# Patient Record
Sex: Female | Born: 1965 | Race: White | Hispanic: No | Marital: Married | Smoking: Current every day smoker
Health system: Southern US, Community
[De-identification: ages and names within clinical notes are randomized; demographics above are authoritative.]

## PROBLEM LIST (undated history)

## (undated) DIAGNOSIS — K219 Gastro-esophageal reflux disease without esophagitis: Secondary | ICD-10-CM

## (undated) DIAGNOSIS — Z91018 Allergy to other foods: Secondary | ICD-10-CM

## (undated) DIAGNOSIS — Z87442 Personal history of urinary calculi: Secondary | ICD-10-CM

## (undated) DIAGNOSIS — E559 Vitamin D deficiency, unspecified: Secondary | ICD-10-CM

## (undated) DIAGNOSIS — K5900 Constipation, unspecified: Secondary | ICD-10-CM

## (undated) DIAGNOSIS — M545 Low back pain, unspecified: Secondary | ICD-10-CM

## (undated) DIAGNOSIS — I10 Essential (primary) hypertension: Secondary | ICD-10-CM

## (undated) DIAGNOSIS — N289 Disorder of kidney and ureter, unspecified: Secondary | ICD-10-CM

## (undated) HISTORY — PX: CYSTOSCOPY W/ URETERAL STENT PLACEMENT: SHX1429

## (undated) HISTORY — DX: Disorder of kidney and ureter, unspecified: N28.9

## (undated) HISTORY — DX: Essential (primary) hypertension: I10

## (undated) HISTORY — DX: Constipation, unspecified: K59.00

## (undated) HISTORY — DX: Low back pain, unspecified: M54.50

## (undated) HISTORY — DX: Allergy to other foods: Z91.018

## (undated) HISTORY — PX: BLADDER ASPIRATION: SHX472

## (undated) HISTORY — DX: Vitamin D deficiency, unspecified: E55.9

---

## 1999-08-31 ENCOUNTER — Other Ambulatory Visit: Admission: RE | Admit: 1999-08-31 | Discharge: 1999-08-31 | Payer: Self-pay | Admitting: Obstetrics and Gynecology

## 2000-09-22 ENCOUNTER — Other Ambulatory Visit: Admission: RE | Admit: 2000-09-22 | Discharge: 2000-09-22 | Payer: Self-pay | Admitting: Obstetrics and Gynecology

## 2002-03-18 ENCOUNTER — Other Ambulatory Visit: Admission: RE | Admit: 2002-03-18 | Discharge: 2002-03-18 | Payer: Self-pay | Admitting: Obstetrics and Gynecology

## 2003-07-30 ENCOUNTER — Other Ambulatory Visit: Admission: RE | Admit: 2003-07-30 | Discharge: 2003-07-30 | Payer: Self-pay | Admitting: Obstetrics and Gynecology

## 2004-08-19 ENCOUNTER — Other Ambulatory Visit: Admission: RE | Admit: 2004-08-19 | Discharge: 2004-08-19 | Payer: Self-pay | Admitting: Obstetrics and Gynecology

## 2006-06-19 DIAGNOSIS — C4491 Basal cell carcinoma of skin, unspecified: Secondary | ICD-10-CM

## 2006-06-19 HISTORY — DX: Basal cell carcinoma of skin, unspecified: C44.91

## 2007-05-02 ENCOUNTER — Ambulatory Visit (HOSPITAL_COMMUNITY): Admission: RE | Admit: 2007-05-02 | Discharge: 2007-05-02 | Payer: Self-pay | Admitting: Obstetrics and Gynecology

## 2010-02-07 ENCOUNTER — Encounter (HOSPITAL_COMMUNITY): Payer: Self-pay | Admitting: Obstetrics and Gynecology

## 2010-06-01 NOTE — Op Note (Signed)
Nicole Pacheco, Nicole Pacheco NO.:  [id number]   MEDICAL RECORD NO.:  [id number]          PATIENT TYPE:  AMB   LOCATION:  SDC                           FACILITY:  [facility]   PHYSICIAN:  Juluis Mire, M.D.   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   OPERATIVE PROCEDURE:  1. Mid urethral sling using a transobturator approach.  2. Cystoscopy.   SURGEON:  Juluis Mire, M.D.   ANESTHESIA:  Spinal.   ESTIMATED BLOOD LOSS:  200 mL.   PACKS AND DRAINS:  None.   INTRAOPERATIVE BLOOD REPLACED:  None.   COMPLICATIONS:  None.   INDICATIONS:  As noted in the history and physical.   PROCEDURE AS FOLLOWS:  The patient was taken to the OR and placed in the  supine position.  After a satisfactory level of spinal anesthesia was  obtained, the patient was placed in the dorsal lithotomy position using  the Allen stirrups.  The perineum and vagina were prepped out with  Betadine and draped in a sterile field.  Bladder was emptied by in-and-  out catheterization.  A weighted speculum was placed in the vaginal  vault.  The suburethral area was instilled with 1/2% Xylocaine with  epinephrine.  An incision was made in the vaginal mucosa overlying the  urethra.  We then dissected out laterally to the obturator foramen on  each side.  An area over the obturator foramen on the vulvar area was  identified.  It was below the abductus longus tendon, but just lateral  to the inferior pubic ramus.  This area was infiltrated with 1/2%  Xylocaine with epinephrine, and an incision was made with the knife.   Next, the [facility] halo needles were brought into place.  They  were passed on each side through the skin  through the obturator foramen,  and around the inferior pubic ramus and out through the vaginal opening.  There was no evidence of buttonholing of the vaginal mucosa.  With the  needles in place cystoscopy was performed.  There was no evidence of  urethral or bladder injury from the past.  Both ureteral orifices were  visualized and noted to be spilling clear urine.  The cystoscope was  then removed.  We then brought the polypropylene mesh attached to the  needles, and brought it out through the skin incisions.  We adjusted the  mesh under the urethra to lay flat, but we could easily put a Kelly and  rotate it 90 degrees.  Once the plastic sheaths had  been removed, the  mesh was trimmed at the level of the skin.   Next, the vaginal mucosa was then closed with a room with interrupted  sutures of 2-0 Monocryl.  The skin was closed with Dermabond.  We had  fairly good hemostasis.  A Foley was placed to straight drain.  The  urine was slightly blood tinged, but this was from cystoscopy.  At this  point in time, the patient was taken out of the dorsal lithotomy  position, once alert transferred to recovery in good condition.  Sponge,  instrument and needle count were reported as correct by circulating  nurse.      Juluis Mire, M.D.  Electronically Signed     JSM/MEDQ  D:  05/02/2007  T:  05/02/2007  Job:  [id number]

## 2010-06-01 NOTE — H&P (Signed)
Nicole Pacheco, Nicole Pacheco NO.:  [id number]   MEDICAL RECORD NO.:  [id number]          PATIENT TYPE:  AMB   LOCATION:  SDC                           [facility]   PHYSICIAN:  Juluis Mire, M.D.   DATE OF BIRTH:  07/08/1965   DATE OF ADMISSION:  05/02/2007  DATE OF DISCHARGE:                              HISTORY & PHYSICAL   HISTORY OF PRESENT ILLNESS:  This is a 45 year old gravida 2, para 1,  abortus 1, female presents for mid urethral sling.   In relation to present admission: The patient had trouble with  increasing stress urinary incontinence.  She underwent urodynamic  testing in the office.  She had normal volume studies.  She had a  minimal postvoid residual.  She did leak.  Most leak point pressures  were over 100.  She had one spurious value of 49.  She had normal  urethral pressure profiles.  She had a normal voiding pattern with no  evidence of obstructive voiding pattern after review.  We had discussed  options. This would include biofeedback and bladder retraining versus  surgery.  She is in favor of the latter and proceeds now for mid  urethral sling.  We are going to use an obturator approach.   ALLERGIES:  In terms of allergies, she has no known drug allergies.   MEDICATIONS:  None.   PAST MEDICAL HISTORY:  Usual childhood diseases.   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  For birth control she has a Engineer, civil (consulting)  IUD.  She has had one vaginal delivery and one miscarriage.   FAMILY HISTORY:  Noncontributory.   SOCIAL HISTORY:  Does reveal tobacco use.  No alcohol use.   REVIEW OF SYSTEMS:  Noncontributory.   PHYSICAL EXAMINATION:  VITAL SIGNS:  The patient is afebrile, stable  vital signs.  HEENT: The patient is normocephalic.  Pupils equal and react to light  and accommodation.  Extraocular were intact.  Sclerae and conjunctivae  are clear.  Oropharynx clear.  NECK:  Without  thyromegaly.  BREASTS:  No discrete masses.  LUNGS:  Clear.  CARDIOVASCULAR SYSTEM:  Regular rhythm and rate.  No murmurs or gallops.  ABDOMEN:  Abdominal exam is benign.  No mass, organomegaly or  tenderness.  PELVIC:  Normal external genitalia.  Vaginal mucosa is clear.  Mild  cystourethrocele.  Cervix unremarkable.  IUD string noted.  Uterus  normal size, shape and contour.  Adnexa free of masses or tenderness and  rectovaginal exam is clear.  EXTREMITIES:  Trace edema.  NEUROLOGIC:  Grossly within normal limits.   IMPRESSION:  Stress urinary incontinence.   PLAN:  The patient will undergo mid urethral sling use an obturator  approach.  Success rates of 80% are quoted.  The risks of surgery  explained including the risk of infection.  The risk of vascular injury  that could lead to hemorrhage requiring transfusion with the risk of  AIDS or hepatitis.  Risk of injury to adjacent organs such as bladder,  urethra or ureters  that could require further surgery.  Risk of  obturator nerve injury that could lead to chronic leg pain and weakness.  There is an increased risk of increasing bladder spasms that could lead  to urinary leakage requiring long-term medical therapy.  Over tightening  the sling could lead to inability to void requiring taking down the  sling with return of incontinence.  Risk of mesh erosions explained.  This could require removing the mesh.  There is also the risk of deep  venous thrombosis and pulmonary embolus.  The patient expressed  understanding of indications, risks and alternatives.      Juluis Mire, M.D.  Electronically Signed     JSM/MEDQ  D:  05/02/2007  T:  05/02/2007  Job:  [id number]

## 2010-09-22 ENCOUNTER — Emergency Department (HOSPITAL_BASED_OUTPATIENT_CLINIC_OR_DEPARTMENT_OTHER)
Admission: EM | Admit: 2010-09-22 | Discharge: 2010-09-22 | Disposition: A | Discharge status: Home / Self Care | Payer: Managed Care, Other (non HMO) | Attending: Emergency Medicine | Admitting: Emergency Medicine

## 2010-09-22 ENCOUNTER — Emergency Department (INDEPENDENT_AMBULATORY_CARE_PROVIDER_SITE_OTHER): Payer: Managed Care, Other (non HMO)

## 2010-09-22 ENCOUNTER — Encounter: Payer: Self-pay | Admitting: *Deleted

## 2010-09-22 DIAGNOSIS — M25529 Pain in unspecified elbow: Secondary | ICD-10-CM | POA: Insufficient documentation

## 2010-09-22 DIAGNOSIS — W010XXA Fall on same level from slipping, tripping and stumbling without subsequent striking against object, initial encounter: Secondary | ICD-10-CM | POA: Insufficient documentation

## 2010-09-22 DIAGNOSIS — Y92009 Unspecified place in unspecified non-institutional (private) residence as the place of occurrence of the external cause: Secondary | ICD-10-CM | POA: Insufficient documentation

## 2010-09-22 DIAGNOSIS — S6990XA Unspecified injury of unspecified wrist, hand and finger(s), initial encounter: Secondary | ICD-10-CM

## 2010-09-22 DIAGNOSIS — W19XXXA Unspecified fall, initial encounter: Secondary | ICD-10-CM

## 2010-09-22 DIAGNOSIS — M25519 Pain in unspecified shoulder: Secondary | ICD-10-CM | POA: Insufficient documentation

## 2010-09-22 DIAGNOSIS — S59909A Unspecified injury of unspecified elbow, initial encounter: Secondary | ICD-10-CM

## 2010-09-22 DIAGNOSIS — F17200 Nicotine dependence, unspecified, uncomplicated: Secondary | ICD-10-CM | POA: Insufficient documentation

## 2010-09-22 MED ORDER — IBUPROFEN 800 MG PO TABS: 800.0000 mg | ORAL_TABLET | Freq: Three times a day (TID) | ORAL | Status: AC

## 2010-09-22 NOTE — ED Provider Notes (Signed)
History     CSN: [id number] Arrival date & time: 09/22/2010  5:29 PM  Chief Complaint  Patient presents with  . Arm Injury   HPI Comments: Right elbow pain for the past week. Patient denies any specific trauma but was lifting some bottles in a grocery store and noticed arm pain later on that day. The pain is over her right lateral elbow and worse with palpation. She denies any weakness, numbness, tingling, fevers or vomiting. She has no pain or problems in her other joints. She also had a fall 4 days ago onto the same elbow and aggravated at that time. She has not been taking any medications at home.  The history is provided by the patient.    Past Medical History  Diagnosis Date  . Kidney calculi     Past Surgical History  Procedure Date  . Cesarean section   . Bladder aspiration     History reviewed. No pertinent family history.  History  Substance Use Topics  . Smoking status: Current Everyday Smoker -- 0.5 packs/day  . Smokeless tobacco: Not on file  . Alcohol Use: No    OB History    Grav Para Term Preterm Abortions TAB SAB Ect Mult Living                  Review of Systems  Constitutional: Negative for activity change and appetite change.  HENT: Negative for congestion and rhinorrhea.   Respiratory: Negative for cough, chest tightness and shortness of breath.   Gastrointestinal: Negative for vomiting and abdominal pain.  Genitourinary: Negative for dysuria and hematuria.  Musculoskeletal: Positive for myalgias and arthralgias.  Neurological: Negative for headaches.  Hematological: Negative for adenopathy.    Physical Exam  BP 151/93  Pulse 84  Temp(Src) 98.2 F (36.8 C) (Oral)  Resp 16  Wt 300 lb (136.079 kg)  SpO2 100%  Physical Exam  Constitutional: She is oriented to person, place, and time. She appears well-developed and well-nourished. No distress.  HENT:  Head: Normocephalic and atraumatic.  Mouth/Throat: Oropharynx is clear and moist. No  oropharyngeal exudate.  Eyes: Conjunctivae are normal. Pupils are equal, round, and reactive to light.  Neck: Normal range of motion.  Cardiovascular: Normal rate, regular rhythm and normal heart sounds.   Pulmonary/Chest: Effort normal and breath sounds normal. No respiratory distress.  Abdominal: Soft. There is no tenderness. There is no rebound and no guarding.  Musculoskeletal: Normal range of motion. She exhibits tenderness. She exhibits no edema.       Right elbow with tenderness to palpation over the lateral and medial condyles. There is full range of motion of the elbow joint. There is no effusion. There is no skin change. She does have some pain with pronation and supination.  Neurological: She is alert and oriented to person, place, and time. No cranial nerve deficit.  Skin: Skin is warm.    ED Course  Procedures  MDM Atraumatic elbow pain, neurovascularly intact with normal strength. No evidence of joint infection. Given pain with pronation and supination possibility of radial head fracture exists. We'll obtain imaging. Even if negative for fracture we'll place a sling and treat as suspected radial head fracture.  No results found for this or any previous visit. Dg Elbow Complete Right  09/22/2010  *RADIOLOGY REPORT*  Clinical Data: Pain post fall.  RIGHT ELBOW - COMPLETE 3+ VIEW  Comparison: None.  Findings: No effusion. Negative for fracture, dislocation, or other acute abnormality.  Normal alignment and  mineralization. No significant degenerative change.  Regional soft tissues unremarkable.  IMPRESSION:  Negative  Original Report Authenticated By: Osa Craver, M.D.          Glynn Octave, MD 09/22/10 [phone]

## 2010-09-22 NOTE — ED Notes (Signed)
Pt c/o right arm/elbow pain x 1 week.

## 2010-10-12 LAB — CBC
HCT: 38
MCHC: 35.9
MCV: 86.9
WBC: 7.8

## 2012-03-12 ENCOUNTER — Ambulatory Visit: Payer: Managed Care, Other (non HMO) | Attending: Sports Medicine | Admitting: Physical Therapy

## 2012-03-12 DIAGNOSIS — M2560 Stiffness of unspecified joint, not elsewhere classified: Secondary | ICD-10-CM | POA: Insufficient documentation

## 2012-03-12 DIAGNOSIS — M79609 Pain in unspecified limb: Secondary | ICD-10-CM | POA: Insufficient documentation

## 2012-03-12 DIAGNOSIS — M542 Cervicalgia: Secondary | ICD-10-CM | POA: Insufficient documentation

## 2012-03-12 DIAGNOSIS — IMO0001 Reserved for inherently not codable concepts without codable children: Secondary | ICD-10-CM | POA: Insufficient documentation

## 2012-03-14 ENCOUNTER — Ambulatory Visit: Payer: Managed Care, Other (non HMO) | Admitting: Physical Therapy

## 2012-03-19 ENCOUNTER — Encounter: Payer: Managed Care, Other (non HMO) | Admitting: Rehabilitation

## 2012-03-20 ENCOUNTER — Ambulatory Visit: Payer: Managed Care, Other (non HMO) | Attending: Sports Medicine | Admitting: Physical Therapy

## 2012-03-20 DIAGNOSIS — M542 Cervicalgia: Secondary | ICD-10-CM | POA: Insufficient documentation

## 2012-03-20 DIAGNOSIS — M79609 Pain in unspecified limb: Secondary | ICD-10-CM | POA: Insufficient documentation

## 2012-03-20 DIAGNOSIS — IMO0001 Reserved for inherently not codable concepts without codable children: Secondary | ICD-10-CM | POA: Insufficient documentation

## 2012-03-20 DIAGNOSIS — M2560 Stiffness of unspecified joint, not elsewhere classified: Secondary | ICD-10-CM | POA: Insufficient documentation

## 2012-03-22 ENCOUNTER — Ambulatory Visit: Payer: Managed Care, Other (non HMO) | Admitting: Rehabilitation

## 2012-03-26 ENCOUNTER — Ambulatory Visit: Payer: Managed Care, Other (non HMO) | Admitting: Rehabilitation

## 2012-03-29 ENCOUNTER — Ambulatory Visit: Payer: Managed Care, Other (non HMO) | Admitting: Physical Therapy

## 2012-04-02 ENCOUNTER — Ambulatory Visit: Payer: Managed Care, Other (non HMO) | Admitting: Rehabilitation

## 2012-04-05 ENCOUNTER — Ambulatory Visit: Payer: Managed Care, Other (non HMO) | Admitting: Rehabilitation

## 2012-04-09 ENCOUNTER — Ambulatory Visit: Payer: Managed Care, Other (non HMO) | Admitting: Rehabilitation

## 2012-04-12 ENCOUNTER — Ambulatory Visit: Payer: Managed Care, Other (non HMO) | Admitting: Rehabilitation

## 2012-04-18 ENCOUNTER — Ambulatory Visit: Payer: Managed Care, Other (non HMO) | Attending: Sports Medicine | Admitting: Rehabilitation

## 2012-04-18 DIAGNOSIS — M79609 Pain in unspecified limb: Secondary | ICD-10-CM | POA: Insufficient documentation

## 2012-04-18 DIAGNOSIS — IMO0001 Reserved for inherently not codable concepts without codable children: Secondary | ICD-10-CM | POA: Insufficient documentation

## 2012-04-18 DIAGNOSIS — M2560 Stiffness of unspecified joint, not elsewhere classified: Secondary | ICD-10-CM | POA: Insufficient documentation

## 2012-04-18 DIAGNOSIS — M542 Cervicalgia: Secondary | ICD-10-CM | POA: Insufficient documentation

## 2012-04-23 ENCOUNTER — Ambulatory Visit: Payer: Managed Care, Other (non HMO) | Admitting: Rehabilitation

## 2012-04-25 ENCOUNTER — Ambulatory Visit: Payer: Managed Care, Other (non HMO) | Admitting: Rehabilitation

## 2012-05-01 ENCOUNTER — Ambulatory Visit: Payer: Managed Care, Other (non HMO) | Admitting: Rehabilitation

## 2012-05-03 ENCOUNTER — Ambulatory Visit: Payer: Managed Care, Other (non HMO) | Admitting: Rehabilitation

## 2012-05-07 ENCOUNTER — Ambulatory Visit: Payer: Managed Care, Other (non HMO) | Admitting: Rehabilitation

## 2012-05-10 ENCOUNTER — Encounter: Payer: Managed Care, Other (non HMO) | Admitting: Rehabilitation

## 2012-05-14 ENCOUNTER — Encounter: Payer: Managed Care, Other (non HMO) | Admitting: Rehabilitation

## 2012-05-17 ENCOUNTER — Encounter: Payer: Managed Care, Other (non HMO) | Admitting: Rehabilitation

## 2014-03-12 ENCOUNTER — Encounter: Payer: Self-pay | Admitting: Rehabilitation

## 2014-03-12 ENCOUNTER — Ambulatory Visit: Payer: 59 | Attending: Orthopedic Surgery | Admitting: Rehabilitation

## 2014-03-12 DIAGNOSIS — M542 Cervicalgia: Secondary | ICD-10-CM | POA: Insufficient documentation

## 2014-03-12 NOTE — Patient Instructions (Addendum)
Flexibility: Upper Trapezius Stretch   Gently grasp right side of head while reaching behind back with other hand. Tilt head away until a gentle stretch is felt. Hold _30___ seconds. Repeat _2_ times per set. Do __1__ sets per session. Do __2_ sessions per day.  http://orth.exer.us/340   Copyright  VHI. All rights reserved.  Levator Scapula Stretch   Place left hand on same side shoulder blade. With other hand, gently stretch head down and away. Hold _30___ seconds. Repeat _2___ times per set. Do __1__ sets per session. Do __2__ sessions per day.  http://orth.exer.us/348   Copyright  VHI. All rights reserved.  Flexibility: Corner Stretch   Standing in corner with hands just above shoulder level and feet ____ inches from corner, lean forward until a comfortable stretch is felt across chest. Hold _30___ seconds. Repeat _2___ times per set. Do __1__ sets per session. Do _2___ sessions per day.  http://orth.exer.us/342   Copyright  VHI. All rights reserved.  Flexibility: Neck Retraction   Pull head straight back, keeping eyes and jaw level. Repeat _15___ times per set. Do __1__ sets per session. Do __3__ sessions per day.  http://orth.exer.us/344   Copyright  VHI. All rights reserved.   Scapular Retraction (Standing)   With arms at sides, pinch shoulder blades together. Repeat _10___ times per set. Hold 10 seconds. Do _3___ sessions per day.  http://orth.exer.us/944   Copyright  VHI. All rights reserved.

## 2014-03-12 NOTE — Therapy (Signed)
[facility] [address]  [place], [place], [address] Phone: [phone]   Fax:  [phone]  Physical Therapy Evaluation  Patient Details  Name: Nicole Pacheco MRN: [id number] Date of Birth: 11/07/1965 Referring Provider:  Melina Schools, MD  Encounter Date: 03/12/2014      PT End of Session - 03/12/14 1708    Visit Number 1   Number of Visits 8   Date for PT Re-Evaluation 04/09/14   PT Start Time 8756   PT Stop Time 4332   PT Time Calculation (min) 60 min   Activity Tolerance --  good response to traction      Past Medical History  Diagnosis Date  . Kidney calculi     Past Surgical History  Procedure Laterality Date  . Cesarean section    . Bladder aspiration      There were no vitals taken for this visit.  Visit Diagnosis:  Neck pain on right side - Plan: PT plan of care cert/re-cert      Subjective Assessment - 03/12/14 1616    Symptoms Pt presents with c/o constant R sided neck, shoulder blade, and arm pain that has returned in the past month.  pt was seen here for the same issue that was resolved.  reports not doing exercises like she should.     Diagnostic tests xray showing DDD/OA   Patient Stated Goals decrease pain   Currently in Pain? Yes   Pain Score 6    Pain Location --  R neck and shoulder blade region   Pain Descriptors / Indicators Stabbing;Sharp   Pain Radiating Towards the R hand; feels more like a soreness.  Tingling does occur 2-3x per day with no pattern.    Pain Onset More than a month ago   Aggravating Factors  sleeping position, just hurts all the time, work   Pain Relieving Factors not moving   Effect of Pain on Daily Activities not cleaning the house          [facility] PT Assessment - 03/12/14 1610    Assessment   Medical Diagnosis neck OA/DDD   Onset Date 02/09/14   Next MD Visit as needed   Prior Therapy yes   Precautions   Precautions None   Balance Screen    Has the patient fallen in the past 6 months No   Prior Function   Level of Independence Independent with basic ADLs   Observation/Other Assessments   Focus on Therapeutic Outcomes (FOTO)  44%   Sensation   Light Touch Appears Intact   ROM / Strength   AROM / PROM / Strength AROM;PROM;Strength   AROM   Cervical Flexion 35   Cervical Extension 20  pain   Cervical - Right Side Bend 15  ** arm pain increased   Cervical - Left Side Bend 30   Cervical - Right Rotation 60   Cervical - Left Rotation 60   PROM   Overall PROM Comments pain with sidebend R to 15   Strength   Overall Strength Comments myotomes strong and painfree, grip equal   Palpation   Palpation +2 ttp R upper trapezius and cervical paraspinals   Special Tests    Special Tests Cervical   Cervical Tests Spurling's;Dictraction   Spurling's   Findings Negative   Side Right   Distraction Test   Findngs Positive   other    Comment all nerve tension tests normal  Performed HEP per patient instructions x 2 each with vcs for completion Cervical traction: 15#/0#, 60sec/20sec, x 38min at 20deg pull                  PT Education - 03/12/14 1707    Education provided Yes   Education Details HEP, POC, diagnosis   Person(s) Educated Patient   Methods Explanation;Demonstration;Handout   Comprehension Verbalized understanding;Returned demonstration          PT Short Term Goals - 03/12/14 1713    PT SHORT TERM GOAL #1   Title independent with initial HEP   Time 1   Period Weeks   Status On-going           PT Long Term Goals - 03/12/14 1713    PT LONG TERM GOAL #1   Title improve pain to intermittent only   Time 4   Period Weeks   Status New   PT LONG TERM GOAL #2   Title improve cervical sidebending to WNL without referred arm pain   Time 4   Period Weeks   Status New   PT LONG TERM GOAL #3   Title return to sleeping without increased pain   Time 4   Period Weeks                Plan - 03/12/14 1709    Clinical Impression Statement Pt presents with symptoms consistent with R sided cervical degeneration with referred pain into the R UE.  No nerve involvement indicated by special tests.  Pain is constant at rest and worsens with cervical sidebending to the Right.  Pain is decreased greatly with cervical traction.     Pt will benefit from skilled therapeutic intervention in order to improve on the following deficits Decreased range of motion;Pain   Rehab Potential Excellent   Clinical Impairments Affecting Rehab Potential none   PT Frequency 2x / week   PT Duration 4 weeks   PT Treatment/Interventions Electrical Stimulation;Moist Heat;Traction;Therapeutic exercise;Manual techniques   PT Next Visit Plan joint mobilizations, STM R side cervical, postural TE, traction   Consulted and Agree with Plan of Care Patient         Problem List There are no active problems to display for this patient.   Stark Bray, DPT, CMP 03/12/2014, 5:17 PM  [facility] [address]  [place], [place], [address] Phone: [phone]   Fax:  [phone]

## 2014-03-19 ENCOUNTER — Encounter: Payer: Self-pay | Admitting: Physical Therapy

## 2014-03-19 ENCOUNTER — Ambulatory Visit: Payer: 59 | Attending: Orthopedic Surgery | Admitting: Physical Therapy

## 2014-03-19 DIAGNOSIS — M436 Torticollis: Secondary | ICD-10-CM

## 2014-03-19 DIAGNOSIS — M542 Cervicalgia: Secondary | ICD-10-CM | POA: Insufficient documentation

## 2014-03-19 NOTE — Therapy (Signed)
[facility] [address]  [place], [place], [address] Phone: [phone]   Fax:  [phone]  Physical Therapy Treatment  Patient Details  Name: Carsen Machi MRN: [id number] Date of Birth: Mar 23, 1965 Referring Provider:  Melina Schools, MD  Encounter Date: 03/19/2014      PT End of Session - 03/19/14 1621    Visit Number 2   Number of Visits 8   Date for PT Re-Evaluation 04/09/14   PT Start Time 5956   PT Stop Time 1618   PT Time Calculation (min) 48 min      Past Medical History  Diagnosis Date  . Kidney calculi     Past Surgical History  Procedure Laterality Date  . Cesarean section    . Bladder aspiration      There were no vitals taken for this visit.  Visit Diagnosis:  Neck pain on right side  Neck stiffness      Subjective Assessment - 03/19/14 1535    Symptoms cc = R neck and scpular pain which she rates 5/10 currently.  States has difficulty sleeping due to pain stating gets 5-6 hours/sleep night.  c/o intermittent N/T into R digits #2-4 with reaching and with driving.   Currently in Pain? Yes   Pain Score 5    Pain Location --  R neck and scapula   Multiple Pain Sites No           TODAY'S TREATMENT: MANUAL THERAPY - seated STM R upper trap, lateral infraspinatus, and proximal triceps (all due to pain preventing her from lying supine).  Seated and Supine R 1st rib mobes grade 2 to 3 (good benefit).  Supine c-spine retraction mobes grade 3 (fair tolerance), supine L SB and R side-glide mobes grade 3 (good tolerance and pain reduction).  R UE nerve glides (POS median nerve symptom provocation today with nerve glides.  No radial nerve provocation with nerve glides despite radial nerve distribution of pain with palpation to radial nerve at proximal triceps).  Pt requested no traction today due to discomfort with lying supine.                   PT Short Term Goals -  03/19/14 1645    PT SHORT TERM GOAL #1   Title independent with initial HEP   Status Achieved           PT Long Term Goals - 03/19/14 1645    PT LONG TERM GOAL #1   Title improve pain to intermittent only   Status On-going   PT LONG TERM GOAL #2   Title improve cervical sidebending to WNL without referred arm pain   Status On-going   PT LONG TERM GOAL #3   Title return to sleeping without increased pain   Status On-going               Plan - 03/19/14 1622    Clinical Impression Statement pt with R neck pain and LOM along with R UE pain and intermittent N/T.  N/T noted in digits #2-4 and pain in R UE noted in posterior upper arm and into forearm.  UE pain as well as N/T easily provoked with closing R c-spine facets.  Also able to reproduce UE pain and hand N/T with palpation/STM to proximal R triceps.  Good relief with 1st rib mobes and with facet opening mobes.  Pt asked to not have traction today.   PT Next  Visit Plan joint mobilizations including R 1st rib, STM R side cervical and upper scapula, postural exercises and education, traction   Consulted and Agree with Plan of Care Patient        Problem List There are no active problems to display for this patient.   [facility] PT, OCS 03/19/2014, 5:39 PM  [facility] [address]  [place], [place], [address] Phone: [phone]   Fax:  [phone]

## 2014-03-25 ENCOUNTER — Ambulatory Visit: Payer: 59 | Admitting: Physical Therapy

## 2014-03-25 DIAGNOSIS — M542 Cervicalgia: Secondary | ICD-10-CM | POA: Diagnosis not present

## 2014-03-25 DIAGNOSIS — M436 Torticollis: Secondary | ICD-10-CM

## 2014-03-25 NOTE — Therapy (Signed)
[facility] [address]  [place], [place], [address] Phone: [phone]   Fax:  [phone]  Physical Therapy Treatment  Patient Details  Name: Katena Petitjean MRN: [id number] Date of Birth: [birth year] Referring Provider:  Melina Schools, MD  Encounter Date: 03/25/2014      PT End of Session - 03/25/14 1520    Visit Number 3   Number of Visits 8   Date for PT Re-Evaluation 04/09/14   PT Start Time 1505   PT Stop Time 1535   PT Time Calculation (min) 30 min   Activity Tolerance Patient limited by pain   Behavior During Therapy [facility] for tasks assessed/performed      Past Medical History  Diagnosis Date  . Kidney calculi     Past Surgical History  Procedure Laterality Date  . Cesarean section    . Bladder aspiration      There were no vitals taken for this visit.  Visit Diagnosis:  Neck pain on right side  Neck stiffness      Subjective Assessment - 03/25/14 1506    Symptoms pt is "really hurting" today.  Pain in neck/shoulder blade and RUE   Currently in Pain? Yes   Pain Score 7    Pain Location Neck   Pain Orientation Right;Posterior   Pain Descriptors / Indicators Constant;Stabbing;Shooting   Pain Type Chronic pain   Pain Onset More than a month ago                    [facility] Adult PT Treatment/Exercise - 03/25/14 1508    Neck Exercises: Machines for Strengthening   UBE (Upper Arm Bike) Level 1.0 x 5 min; 2.5 min forward/2.5 min backward   Modalities   Modalities Electrical Stimulation;Moist Heat   Moist Heat Therapy   Number Minutes Moist Heat 15 Minutes   Moist Heat Location Other (comment)  neck   Electrical Stimulation   Electrical Stimulation Location neck; t spine   Electrical Stimulation Action IFC   Electrical Stimulation Parameters to tolerance   Electrical Stimulation Goals Pain   Neck Exercises: Stretches   Upper Trapezius Stretch 2 reps;20 seconds  bil   Upper  Trapezius Stretch Limitations increased time and cues for technique                  PT Short Term Goals - 03/19/14 1645    PT SHORT TERM GOAL #1   Title independent with initial HEP   Status Achieved           PT Long Term Goals - 03/25/14 1522    PT LONG TERM GOAL #1   Title improve pain to intermittent only   Time 4   Period Weeks   Status On-going   PT LONG TERM GOAL #2   Title improve cervical sidebending to WNL without referred arm pain   Time 4   Period Weeks   Status On-going   PT LONG TERM GOAL #3   Title return to sleeping without increased pain   Time 4   Period Weeks   Status On-going               Plan - 03/25/14 1521    Clinical Impression Statement Pt arrived 20 min late for session and reports continued increase in pain.  Declined traction due to unability to tolerate postion.  Trialed estim and heat to see if pt notices decrease in pain.  Limited progress  towards goals.   PT Next Visit Plan joint mobilizations including R 1st rib, STM R side cervical and upper scapula, postural exercises and education, traction   Consulted and Agree with Plan of Care Patient        Problem List There are no active problems to display for this patient.  Laureen Abrahams, PT, DPT 03/25/2014 3:36 PM  [facility] [address]  [place], [place], [address] Phone: [phone]   Fax:  [phone]

## 2014-03-26 ENCOUNTER — Encounter: Payer: Self-pay | Admitting: Physical Therapy

## 2014-03-26 ENCOUNTER — Ambulatory Visit: Payer: 59 | Admitting: Physical Therapy

## 2014-03-26 DIAGNOSIS — M542 Cervicalgia: Secondary | ICD-10-CM

## 2014-03-26 NOTE — Patient Instructions (Signed)
Instructed in HEP to try: Corner pec stretch with chin tuck 3x20" 3x/day Low row Black TB with full scap retraction 20x 3x/day R UE nerve glides to tolerance 10x5" 3x/day

## 2014-03-26 NOTE — Therapy (Signed)
[facility] [address]  [place], [place], [address] Phone: [phone]   Fax:  [phone]  Physical Therapy Treatment  Patient Details  Name: Nicole Pacheco MRN: [id number] Date of Birth: 01-Jul-1965 Referring Provider:  Melina Schools, MD  Encounter Date: 03/26/2014      PT End of Session - 03/26/14 1540    Visit Number (p) 4   Number of Visits (p) 8   Date for PT Re-Evaluation (p) 04/09/14   PT Start Time (p) 1534      Past Medical History  Diagnosis Date  . Kidney calculi     Past Surgical History  Procedure Laterality Date  . Cesarean section    . Bladder aspiration      There were no vitals taken for this visit.  Visit Diagnosis:  No diagnosis found.      Subjective Assessment - 03/26/14 1535    Symptoms states feels a little better today stating pain is 4/10 at rest but up to 8/10 with movement.  States N/T has increased to include whole R hand (was only digits #2-3 last week). States noted 1 day of benefit following last manual therapy treatment by this PT. She states she is interested in trying dry needling today.   Currently in Pain? Yes   Pain Score 4    Pain Location Neck   Pain Orientation Right;Posterior  neck and upper R scapula         MANUAL THERAPY - seated STM R upper trap and scalenes.  Seated R 1st rib mobes grade 2 to 3. Dry needling seated (see below).  After sitting up from prolonged forward bend posture for above treatment pt noted increased pain to R UE.  She was positioned supine and applied manual traction and symptoms abolished.  Able to cause immediate return of symptoms with extension or with R side-bending PROM while supine.  No symptom provocation with flexion or L side-bending.  Performed Grade 3 retraction mobes to tolerance. Then pt performed Supine upper c-spine flexion 5x5".  After manual completed pt pain-free but upon sitting up R UE pain and N/T  returned.  Mechanical Traction: 12#/6#, 60/20", 20dg pull, 12' with IFC to upper t-spine and mid c-spine during traction x 12' (80-150Hz )  HEP instruct and perform.           Trigger Point Dry Needling - 03/26/14 1640    Consent Given? Yes   Muscles Treated Upper Body Upper trapezius;Suboccipitals muscle group   Upper Trapezius Response Twitch reponse elicited  no sig benefit noted   SubOccipitals Response --  no response, no benefit noted                PT Short Term Goals - 03/19/14 1645    PT SHORT TERM GOAL #1   Title independent with initial HEP   Status Achieved           PT Long Term Goals - 03/25/14 1522    PT LONG TERM GOAL #1   Title improve pain to intermittent only   Time 4   Period Weeks   Status On-going   PT LONG TERM GOAL #2   Title improve cervical sidebending to WNL without referred arm pain   Time 4   Period Weeks   Status On-going   PT LONG TERM GOAL #3   Title return to sleeping without increased pain   Time 4   Period Weeks   Status On-going  Plan - 03/25/14 1521    Clinical Impression Statement Pt arrived 20 min late for session and reports continued increase in pain.  Declined traction due to unability to tolerate postion.  Trialed estim and heat to see if pt notices decrease in pain.  Limited progress towards goals.   PT Next Visit Plan joint mobilizations including R 1st rib, STM R side cervical and upper scapula, postural exercises and education, traction   Consulted and Agree with Plan of Care Patient        Problem List There are no active problems to display for this patient.   [facility] PT, OCS 03/26/2014, 4:41 PM  [facility] [address]  [place], [place], [address] Phone: [phone]   Fax:  [phone]

## 2014-04-01 ENCOUNTER — Ambulatory Visit: Payer: 59 | Admitting: Physical Therapy

## 2014-04-01 DIAGNOSIS — M436 Torticollis: Secondary | ICD-10-CM

## 2014-04-01 DIAGNOSIS — M542 Cervicalgia: Secondary | ICD-10-CM

## 2014-04-01 NOTE — Therapy (Addendum)
[facility] [address]  [place], [place], [address] Phone: [phone]   Fax:  [phone]  Physical Therapy Treatment  Patient Details  Name: Nicole Pacheco MRN: [id number] Date of Birth: 07/29/1965 Referring Provider:  Melina Schools, MD  Encounter Date: 04/01/2014      PT End of Session - 04/01/14 0850    Visit Number 5   Number of Visits 8   Date for PT Re-Evaluation 04/09/14   PT Start Time 0848      Past Medical History  Diagnosis Date  . Kidney calculi     Past Surgical History  Procedure Laterality Date  . Cesarean section    . Bladder aspiration      There were no vitals filed for this visit.  Visit Diagnosis:  Neck pain on right side  Neck stiffness      Subjective Assessment - 04/01/14 0846    Symptoms states "top part of neck feels better" but continued pain in lower neck and all down shoulder/arm.  States pain 5-6/10 on AVG and 8/10 at worst.  Not sleeping well due to pain.  Sleeping in recliner at times due to pain.  Cont'd c/o N/T into R hand.  Pt to MD later today per my recs at last treatment due to increased pain.   Currently in Pain? Yes   Pain Score 5    Pain Location Neck   Pain Orientation Right;Posterior  neck and upper scapula   Multiple Pain Sites No         TODAY'S TREATMENT: MANUAL THERAPY - supine STM R scalenes, c-spine manual distraction and symptoms nearly abolished, performed R 1st rib mobes grade 2 and some grade 3 (no further change in symptoms), mobes into Ext or R SB caused R UE pain and NT.  Performed R side glide mobes in neutral and L SB/L Rot which further improved symptoms but did not increase ability to R side-bend or extend.  Attempted retraction mobes and only tolerated grade 2, grade 3 would create R UE radicular symptoms.  No sig improvement in ROM with manual but pt did report mild reduction in pain from 5/10 to 3-4/10 upon returning to seated  position (was painfree at times while performing manual in supine).  Mechanical Traction: 14#/7#, 60/20", 20dg pull, 17' with IFC to upper t-spine and mid c-spine during traction x 18' (80-150Hz ).  Reports "feel a little better but still in pain" following traction and IFC.                        PT Short Term Goals - 03/19/14 1645    PT SHORT TERM GOAL #1   Title independent with initial HEP   Status Achieved           PT Long Term Goals - 04/01/14 0936    PT LONG TERM GOAL #1   Title improve pain to intermittent only   Status On-going   PT LONG TERM GOAL #2   Title improve cervical sidebending to WNL without referred arm pain   Status On-going   PT LONG TERM GOAL #3   Title return to sleeping without increased pain   Status On-going               Plan - 04/01/14 0930    Clinical Impression Statement pt still with poorly controlled symptoms.  She states she did feel pretty good Friday and Saturday since last  treatment but otherwise states nearly constant pain and N/T into R UE. Pt with apparent nerve root impingemnt R c-spine but unclear if soley facet in nature or if some disc component given variability of symptom intensity.  Pt to see MD later today and I imagine an injection and/or MRI will be recommended.  Pt states she was offered an injection prior to beginning PT but she declined at that time but is now interested if thought beneficial.  Pt did not have N/T to R UE at last MD visit but she has had these symptoms since beginning PT intervention.   Pt will benefit from skilled therapeutic intervention in order to improve on the following deficits Decreased range of motion;Pain;Improper body mechanics   Rehab Potential Good   PT Treatment/Interventions Electrical Stimulation;Moist Heat;Traction;Therapeutic exercise;Manual techniques;Dry needling;Patient/family education   PT Next Visit Plan see what MD advised pt   Consulted and Agree with Plan of  Care Patient        Problem List There are no active problems to display for this patient.   [name] PT, OCS  04/01/2014, 9:37 AM  [facility] [address]  [place], [place], [address] Phone: [phone]   Fax:  [phone]    PHYSICAL THERAPY DISCHARGE SUMMARY  Visits from Start of Care: 5  Current functional level related to goals / functional outcomes: unknown   Remaining deficits: No change   Education / Equipment: HEP Plan: Patient agrees to discharge.  Patient goals were not met. Patient is being discharged due to lack of progress.  ?????        We are discharing Nicole Pacheco from PT due to lack of progress.  She was last seen on 04/01/14 and reviewing her medical chart in Epic I see she ended up undergoing cervical fusion last week.  Should she require additional treatment in the future we'd be happy to have her back.  Thank you!  Leonette Most PT, OCS 05/20/2014 2:34 PM

## 2014-04-03 ENCOUNTER — Ambulatory Visit: Payer: 59 | Admitting: Physical Therapy

## 2014-04-07 ENCOUNTER — Ambulatory Visit: Payer: 59 | Admitting: Rehabilitation

## 2014-04-10 ENCOUNTER — Ambulatory Visit: Payer: 59 | Admitting: Physical Therapy

## 2014-05-13 ENCOUNTER — Encounter (HOSPITAL_COMMUNITY): Payer: Self-pay

## 2014-05-13 ENCOUNTER — Encounter (HOSPITAL_COMMUNITY)
Admission: RE | Admit: 2014-05-13 | Discharge: 2014-05-13 | Disposition: A | Discharge status: Home / Self Care | Payer: 59 | Source: Ambulatory Visit | Attending: Orthopedic Surgery | Admitting: Orthopedic Surgery

## 2014-05-13 DIAGNOSIS — M5022 Other cervical disc displacement, mid-cervical region: Secondary | ICD-10-CM | POA: Diagnosis not present

## 2014-05-13 DIAGNOSIS — K219 Gastro-esophageal reflux disease without esophagitis: Secondary | ICD-10-CM | POA: Diagnosis not present

## 2014-05-13 DIAGNOSIS — F17210 Nicotine dependence, cigarettes, uncomplicated: Secondary | ICD-10-CM | POA: Diagnosis not present

## 2014-05-13 DIAGNOSIS — M5412 Radiculopathy, cervical region: Secondary | ICD-10-CM | POA: Diagnosis not present

## 2014-05-13 DIAGNOSIS — E669 Obesity, unspecified: Secondary | ICD-10-CM | POA: Diagnosis not present

## 2014-05-13 HISTORY — DX: Gastro-esophageal reflux disease without esophagitis: K21.9

## 2014-05-13 HISTORY — DX: Personal history of urinary calculi: Z87.442

## 2014-05-13 LAB — SURGICAL PCR SCREEN
MRSA, PCR: NEGATIVE
Staphylococcus aureus: NEGATIVE

## 2014-05-13 LAB — BASIC METABOLIC PANEL
ANION GAP: 9 (ref 5–15)
BUN: 11 mg/dL (ref 6–23)
CO2: 24 mmol/L (ref 19–32)
Calcium: 9 mg/dL (ref 8.4–10.5)
Chloride: 106 mmol/L (ref 96–112)
Creatinine, Ser: 0.68 mg/dL (ref 0.50–1.10)
GFR calc Af Amer: >90 mL/min (ref >90)
GFR calc non Af Amer: >90 mL/min (ref >90)
GLUCOSE: 88 mg/dL (ref 70–99)
Potassium: 4 mmol/L (ref 3.5–5.1)
SODIUM: 139 mmol/L (ref 135–145)

## 2014-05-13 LAB — CBC
HCT: 39.2 % (ref 36.0–46.0)
Hemoglobin: 13.2 g/dL (ref 12.0–15.0)
MCH: 28.8 pg (ref 26.0–34.0)
MCHC: 33.7 g/dL (ref 30.0–36.0)
MCV: 85.4 fL (ref 78.0–100.0)
PLATELETS: 247 10*3/uL (ref 150–400)
RBC: 4.59 MIL/uL (ref 3.87–5.11)
RDW: 15 % (ref 11.5–15.5)
WBC: 8.8 10*3/uL (ref 4.0–10.5)

## 2014-05-13 LAB — HCG, SERUM, QUALITATIVE: PREG SERUM: NEGATIVE

## 2014-05-13 NOTE — Pre-Procedure Instructions (Addendum)
Nicole Pacheco  05/13/2014   Your procedure is scheduled on:  05/15/14  Report to [facility] short stay admitting at 530 AM.  Call this number if you have problems the morning of surgery: [phone]   Remember:   Do not eat food or drink liquids after midnight.   Take these medicines the morning of surgery with A SIP OF WATER: tylenol if needed    STOP all herbel meds, nsaids (aleve,naproxen,advil,ibuprofen) now including vitamins, aspirin   Do not wear jewelry, make-up or nail polish.  Do not wear lotions, powders, or perfumes. You may wear deodorant.  Do not shave 48 hours prior to surgery. Men may shave face and neck.  Do not bring valuables to the hospital.  [facility] is not responsible                  for any belongings or valuables.               Contacts, dentures or bridgework may not be worn into surgery.  Leave suitcase in the car. After surgery it may be brought to your room.  For patients admitted to the hospital, discharge time is determined by your                treatment team.               Patients discharged the day of surgery will not be allowed to drive  home.  Name and phone number of your driver:   Special Instructions:  Special Instructions: [facility] - Preparing for Surgery  Before surgery, you can play an important role.  Because skin is not sterile, your skin needs to be as free of germs as possible.  You can reduce the number of germs on you skin by washing with CHG (chlorahexidine gluconate) soap before surgery.  CHG is an antiseptic cleaner which kills germs and bonds with the skin to continue killing germs even after washing.  Please DO NOT use if you have an allergy to CHG or antibacterial soaps.  If your skin becomes reddened/irritated stop using the CHG and inform your nurse when you arrive at Short Stay.  Do not shave (including legs and underarms) for at least 48 hours prior to the first CHG shower.  You may shave your face.  Please follow  these instructions carefully:   1.  Shower with CHG Soap the night before surgery and the morning of Surgery.  2.  If you choose to wash your hair, wash your hair first as usual with your normal shampoo.  3.  After you shampoo, rinse your hair and body thoroughly to remove the Shampoo.  4.  Use CHG as you would any other liquid soap.  You can apply chg directly  to the skin and wash gently with scrungie or a clean washcloth.  5.  Apply the CHG Soap to your body ONLY FROM THE NECK DOWN.  Do not use on open wounds or open sores.  Avoid contact with your eyes ears, mouth and genitals (private parts).  Wash genitals (private parts)       with your normal soap.  6.  Wash thoroughly, paying special attention to the area where your surgery will be performed.  7.  Thoroughly rinse your body with warm water from the neck down.  8.  DO NOT shower/wash with your normal soap after using and rinsing off the CHG Soap.  9.  Pat yourself dry with a clean  towel.            10.  Wear clean pajamas.            11.  Place clean sheets on your bed the night of your first shower and do not sleep with pets.  Day of Surgery  Do not apply any lotions/deodorants the morning of surgery.  Please wear clean clothes to the hospital/surgery center.   Please read over the following fact sheets that you were given: Pain Booklet, Coughing and Deep Breathing, MRSA Information and Surgical Site Infection Prevention

## 2014-05-14 MED ORDER — DEXTROSE 5 % IV SOLN
3.0000 g | INTRAVENOUS | Status: DC
  Filled 2014-05-14: qty 3000

## 2014-05-15 ENCOUNTER — Ambulatory Visit (HOSPITAL_COMMUNITY): Payer: 59 | Admitting: Certified Registered Nurse Anesthetist

## 2014-05-15 ENCOUNTER — Observation Stay (HOSPITAL_COMMUNITY)
Admission: RE | Admit: 2014-05-15 | Discharge: 2014-05-16 | Disposition: A | Discharge status: Home / Self Care | Payer: 59 | Source: Ambulatory Visit | Attending: Orthopedic Surgery | Admitting: Orthopedic Surgery

## 2014-05-15 ENCOUNTER — Encounter (HOSPITAL_COMMUNITY)
Admission: RE | Disposition: A | Discharge status: Home / Self Care | Payer: Self-pay | Source: Ambulatory Visit | Attending: Orthopedic Surgery

## 2014-05-15 ENCOUNTER — Ambulatory Visit (HOSPITAL_COMMUNITY): Payer: 59

## 2014-05-15 ENCOUNTER — Observation Stay (HOSPITAL_COMMUNITY): Payer: 59

## 2014-05-15 ENCOUNTER — Encounter (HOSPITAL_COMMUNITY): Payer: Self-pay | Admitting: Certified Registered Nurse Anesthetist

## 2014-05-15 DIAGNOSIS — M5022 Other cervical disc displacement, mid-cervical region: Principal | ICD-10-CM | POA: Insufficient documentation

## 2014-05-15 DIAGNOSIS — M5412 Radiculopathy, cervical region: Secondary | ICD-10-CM | POA: Insufficient documentation

## 2014-05-15 DIAGNOSIS — Z419 Encounter for procedure for purposes other than remedying health state, unspecified: Secondary | ICD-10-CM

## 2014-05-15 DIAGNOSIS — E669 Obesity, unspecified: Secondary | ICD-10-CM | POA: Insufficient documentation

## 2014-05-15 DIAGNOSIS — K219 Gastro-esophageal reflux disease without esophagitis: Secondary | ICD-10-CM | POA: Insufficient documentation

## 2014-05-15 DIAGNOSIS — F17210 Nicotine dependence, cigarettes, uncomplicated: Secondary | ICD-10-CM | POA: Insufficient documentation

## 2014-05-15 DIAGNOSIS — Z981 Arthrodesis status: Secondary | ICD-10-CM

## 2014-05-15 DIAGNOSIS — M542 Cervicalgia: Secondary | ICD-10-CM | POA: Diagnosis present

## 2014-05-15 HISTORY — PX: CERVICAL DISC SURGERY: SHX588

## 2014-05-15 HISTORY — PX: ANTERIOR CERVICAL DECOMP/DISCECTOMY FUSION: SHX1161

## 2014-05-15 SURGERY — ANTERIOR CERVICAL DECOMPRESSION/DISCECTOMY FUSION 1 LEVEL
Anesthesia: General | Site: Neck | Laterality: Right

## 2014-05-15 MED ORDER — ONDANSETRON HCL 4 MG/2ML IJ SOLN
INTRAMUSCULAR | Status: DC | PRN
  Administered 2014-05-15: 4 mg via INTRAVENOUS

## 2014-05-15 MED ORDER — ACETAMINOPHEN 10 MG/ML IV SOLN
1000.0000 mg | Freq: Four times a day (QID) | INTRAVENOUS | Status: AC
  Administered 2014-05-15 – 2014-05-16 (×2): 1000 mg via INTRAVENOUS
  Filled 2014-05-15 (×2): qty 100

## 2014-05-15 MED ORDER — DEXTROSE 5 % IV SOLN
3.0000 g | INTRAVENOUS | Status: DC | PRN
  Administered 2014-05-15: 3 g via INTRAVENOUS

## 2014-05-15 MED ORDER — DEXAMETHASONE SODIUM PHOSPHATE 10 MG/ML IJ SOLN
INTRAMUSCULAR | Status: DC | PRN
  Administered 2014-05-15: 10 mg via INTRAVENOUS

## 2014-05-15 MED ORDER — PROMETHAZINE HCL 25 MG/ML IJ SOLN
6.2500 mg | INTRAMUSCULAR | Status: DC | PRN
  Administered 2014-05-15: 12.5 mg via INTRAVENOUS

## 2014-05-15 MED ORDER — SUCCINYLCHOLINE CHLORIDE 20 MG/ML IJ SOLN
INTRAMUSCULAR | Status: DC | PRN
  Administered 2014-05-15: 140 mg via INTRAVENOUS

## 2014-05-15 MED ORDER — PROPOFOL 10 MG/ML IV BOLUS
INTRAVENOUS | Status: DC | PRN
  Administered 2014-05-15: 200 mg via INTRAVENOUS

## 2014-05-15 MED ORDER — OXYCODONE HCL 5 MG PO TABS
ORAL_TABLET | ORAL | Status: AC
  Filled 2014-05-15: qty 2

## 2014-05-15 MED ORDER — OXYCODONE-ACETAMINOPHEN 10-325 MG PO TABS: 1.0000 | ORAL_TABLET | ORAL | Status: DC | PRN

## 2014-05-15 MED ORDER — PROMETHAZINE HCL 25 MG/ML IJ SOLN
INTRAMUSCULAR | Status: AC
  Filled 2014-05-15: qty 1

## 2014-05-15 MED ORDER — ACETAMINOPHEN 10 MG/ML IV SOLN
INTRAVENOUS | Status: DC | PRN
  Administered 2014-05-15: 1000 mg via INTRAVENOUS

## 2014-05-15 MED ORDER — SODIUM CHLORIDE 0.9 % IJ SOLN: 3.0000 mL | INTRAMUSCULAR | Status: DC | PRN

## 2014-05-15 MED ORDER — DEXAMETHASONE SODIUM PHOSPHATE 4 MG/ML IJ SOLN
INTRAMUSCULAR | Status: AC
  Filled 2014-05-15: qty 1

## 2014-05-15 MED ORDER — ROCURONIUM BROMIDE 100 MG/10ML IV SOLN
INTRAVENOUS | Status: DC | PRN
  Administered 2014-05-15: 40 mg via INTRAVENOUS
  Administered 2014-05-15 (×2): 10 mg via INTRAVENOUS

## 2014-05-15 MED ORDER — METHOCARBAMOL 500 MG PO TABS: 500.0000 mg | ORAL_TABLET | Freq: Three times a day (TID) | ORAL | Status: DC | PRN

## 2014-05-15 MED ORDER — SODIUM CHLORIDE 0.9 % IJ SOLN: 3.0000 mL | Freq: Two times a day (BID) | INTRAMUSCULAR | Status: DC

## 2014-05-15 MED ORDER — PROPOFOL 10 MG/ML IV BOLUS
INTRAVENOUS | Status: AC
  Filled 2014-05-15: qty 20

## 2014-05-15 MED ORDER — MENTHOL 3 MG MT LOZG
1.0000 | LOZENGE | OROMUCOSAL | Status: DC | PRN
  Administered 2014-05-15: 3 mg via ORAL
  Filled 2014-05-15: qty 9

## 2014-05-15 MED ORDER — NEOSTIGMINE METHYLSULFATE 10 MG/10ML IV SOLN
INTRAVENOUS | Status: DC | PRN
Start: 2014-05-15 — End: 2014-05-15
  Administered 2014-05-15: 4 mg via INTRAVENOUS

## 2014-05-15 MED ORDER — SUCCINYLCHOLINE CHLORIDE 20 MG/ML IJ SOLN
INTRAMUSCULAR | Status: AC
  Filled 2014-05-15: qty 1

## 2014-05-15 MED ORDER — THROMBIN 20000 UNITS EX SOLR
CUTANEOUS | Status: DC | PRN
  Administered 2014-05-15: 20000 [IU] via TOPICAL

## 2014-05-15 MED ORDER — CEFAZOLIN SODIUM 1-5 GM-% IV SOLN
1.0000 g | Freq: Three times a day (TID) | INTRAVENOUS | Status: AC
Start: 2014-05-15 — End: 2014-05-16
  Administered 2014-05-15 – 2014-05-16 (×2): 1 g via INTRAVENOUS
  Filled 2014-05-15 (×2): qty 50

## 2014-05-15 MED ORDER — BUPIVACAINE-EPINEPHRINE (PF) 0.25% -1:200000 IJ SOLN
INTRAMUSCULAR | Status: AC
  Filled 2014-05-15: qty 30

## 2014-05-15 MED ORDER — ROCURONIUM BROMIDE 50 MG/5ML IV SOLN
INTRAVENOUS | Status: AC
  Filled 2014-05-15: qty 1

## 2014-05-15 MED ORDER — SODIUM CHLORIDE 0.9 % IV SOLN: 250.0000 mL | INTRAVENOUS | Status: DC

## 2014-05-15 MED ORDER — METHOCARBAMOL 1000 MG/10ML IJ SOLN
500.0000 mg | Freq: Four times a day (QID) | INTRAVENOUS | Status: DC | PRN
  Filled 2014-05-15: qty 5

## 2014-05-15 MED ORDER — METHOCARBAMOL 500 MG PO TABS
ORAL_TABLET | ORAL | Status: AC
  Filled 2014-05-15: qty 1

## 2014-05-15 MED ORDER — FENTANYL CITRATE (PF) 250 MCG/5ML IJ SOLN
INTRAMUSCULAR | Status: AC
  Filled 2014-05-15: qty 5

## 2014-05-15 MED ORDER — GLYCOPYRROLATE 0.2 MG/ML IJ SOLN
INTRAMUSCULAR | Status: AC
  Filled 2014-05-15: qty 3

## 2014-05-15 MED ORDER — ACETAMINOPHEN 10 MG/ML IV SOLN
INTRAVENOUS | Status: AC
  Filled 2014-05-15: qty 100

## 2014-05-15 MED ORDER — LIDOCAINE HCL (CARDIAC) 20 MG/ML IV SOLN
INTRAVENOUS | Status: AC
  Filled 2014-05-15: qty 5

## 2014-05-15 MED ORDER — HEMOSTATIC AGENTS (NO CHARGE) OPTIME
TOPICAL | Status: DC | PRN
  Administered 2014-05-15: 1 via TOPICAL

## 2014-05-15 MED ORDER — PHENOL 1.4 % MT LIQD: 1.0000 | OROMUCOSAL | Status: DC | PRN

## 2014-05-15 MED ORDER — MEPERIDINE HCL 25 MG/ML IJ SOLN: 6.2500 mg | INTRAMUSCULAR | Status: DC | PRN

## 2014-05-15 MED ORDER — GLYCOPYRROLATE 0.2 MG/ML IJ SOLN
INTRAMUSCULAR | Status: DC | PRN
  Administered 2014-05-15: 0.6 mg via INTRAVENOUS

## 2014-05-15 MED ORDER — HYDROMORPHONE HCL 1 MG/ML IJ SOLN
INTRAMUSCULAR | Status: AC
  Filled 2014-05-15: qty 1

## 2014-05-15 MED ORDER — 0.9 % SODIUM CHLORIDE (POUR BTL) OPTIME
TOPICAL | Status: DC | PRN
  Administered 2014-05-15: 1000 mL

## 2014-05-15 MED ORDER — MIDAZOLAM HCL 5 MG/5ML IJ SOLN
INTRAMUSCULAR | Status: DC | PRN
  Administered 2014-05-15: 2 mg via INTRAVENOUS

## 2014-05-15 MED ORDER — ONDANSETRON HCL 4 MG/2ML IJ SOLN: 4.0000 mg | INTRAMUSCULAR | Status: DC | PRN

## 2014-05-15 MED ORDER — FENTANYL CITRATE (PF) 100 MCG/2ML IJ SOLN
INTRAMUSCULAR | Status: DC | PRN
  Administered 2014-05-15: 50 ug via INTRAVENOUS
  Administered 2014-05-15: 100 ug via INTRAVENOUS
  Administered 2014-05-15: 50 ug via INTRAVENOUS
  Administered 2014-05-15: 100 ug via INTRAVENOUS
  Administered 2014-05-15 (×2): 50 ug via INTRAVENOUS

## 2014-05-15 MED ORDER — NEOSTIGMINE METHYLSULFATE 10 MG/10ML IV SOLN
INTRAVENOUS | Status: AC
  Filled 2014-05-15: qty 1

## 2014-05-15 MED ORDER — LACTATED RINGERS IV SOLN: INTRAVENOUS | Status: DC

## 2014-05-15 MED ORDER — LACTATED RINGERS IV SOLN
INTRAVENOUS | Status: DC | PRN
  Administered 2014-05-15 (×2): via INTRAVENOUS

## 2014-05-15 MED ORDER — OXYCODONE HCL 5 MG PO TABS
10.0000 mg | ORAL_TABLET | ORAL | Status: DC | PRN
  Administered 2014-05-15: 10 mg via ORAL
  Filled 2014-05-15: qty 2

## 2014-05-15 MED ORDER — ONDANSETRON HCL 4 MG PO TABS: 4.0000 mg | ORAL_TABLET | Freq: Three times a day (TID) | ORAL | Status: DC | PRN

## 2014-05-15 MED ORDER — PHENYLEPHRINE HCL 10 MG/ML IJ SOLN
INTRAMUSCULAR | Status: DC | PRN
  Administered 2014-05-15: 80 ug via INTRAVENOUS

## 2014-05-15 MED ORDER — LIDOCAINE HCL (CARDIAC) 20 MG/ML IV SOLN
INTRAVENOUS | Status: DC | PRN
  Administered 2014-05-15: 40 mg via INTRAVENOUS

## 2014-05-15 MED ORDER — METHOCARBAMOL 500 MG PO TABS
500.0000 mg | ORAL_TABLET | Freq: Four times a day (QID) | ORAL | Status: DC | PRN
  Administered 2014-05-15: 500 mg via ORAL

## 2014-05-15 MED ORDER — MORPHINE SULFATE 2 MG/ML IJ SOLN
1.0000 mg | INTRAMUSCULAR | Status: DC | PRN
  Administered 2014-05-15: 2 mg via INTRAVENOUS
  Administered 2014-05-15: 4 mg via INTRAVENOUS
  Filled 2014-05-15: qty 1
  Filled 2014-05-15: qty 2

## 2014-05-15 MED ORDER — PHENYLEPHRINE 40 MCG/ML (10ML) SYRINGE FOR IV PUSH (FOR BLOOD PRESSURE SUPPORT)
PREFILLED_SYRINGE | INTRAVENOUS | Status: AC
  Filled 2014-05-15: qty 10

## 2014-05-15 MED ORDER — DEXAMETHASONE SODIUM PHOSPHATE 4 MG/ML IJ SOLN
4.0000 mg | Freq: Four times a day (QID) | INTRAMUSCULAR | Status: AC
  Administered 2014-05-15: 4 mg via INTRAVENOUS
  Filled 2014-05-15: qty 1

## 2014-05-15 MED ORDER — HYDROMORPHONE HCL 1 MG/ML IJ SOLN
0.2500 mg | INTRAMUSCULAR | Status: DC | PRN
  Administered 2014-05-15: 1 mg via INTRAVENOUS

## 2014-05-15 MED ORDER — THROMBIN 20000 UNITS EX SOLR
CUTANEOUS | Status: AC
  Filled 2014-05-15: qty 20000

## 2014-05-15 MED ORDER — MIDAZOLAM HCL 2 MG/2ML IJ SOLN
INTRAMUSCULAR | Status: AC
  Filled 2014-05-15: qty 2

## 2014-05-15 MED ORDER — DEXAMETHASONE 4 MG PO TABS
4.0000 mg | ORAL_TABLET | Freq: Four times a day (QID) | ORAL | Status: AC
  Administered 2014-05-15 – 2014-05-16 (×2): 4 mg via ORAL
  Filled 2014-05-15 (×2): qty 1

## 2014-05-15 MED ORDER — MIDAZOLAM HCL 2 MG/2ML IJ SOLN: 0.5000 mg | Freq: Once | INTRAMUSCULAR | Status: DC | PRN

## 2014-05-15 MED ORDER — BUPIVACAINE-EPINEPHRINE 0.25% -1:200000 IJ SOLN
INTRAMUSCULAR | Status: DC | PRN
  Administered 2014-05-15: 3 mL

## 2014-05-15 SURGICAL SUPPLY — 63 items
BIT DRILL SRG 14X2.2XFLT CHK (BIT) ×1 IMPLANT
BIT DRL SRG 14X2.2XFLT CHK (BIT) ×1
BLADE SURG ROTATE 9660 (MISCELLANEOUS) IMPLANT
BUR EGG ELITE 4.0 (BURR) IMPLANT
BUR MATCHSTICK NEURO 3.0 LAGG (BURR) IMPLANT
CANISTER SUCTION 2500CC (MISCELLANEOUS) ×2 IMPLANT
CLSR STERI-STRIP ANTIMIC 1/2X4 (GAUZE/BANDAGES/DRESSINGS) ×2 IMPLANT
CORDS BIPOLAR (ELECTRODE) ×2 IMPLANT
COVER SURGICAL LIGHT HANDLE (MISCELLANEOUS) ×4 IMPLANT
CRADLE DONUT ADULT HEAD (MISCELLANEOUS) ×2 IMPLANT
DEVICE ENDSKLTN TC MED 8MM (Orthopedic Implant) ×1 IMPLANT
DRAPE C-ARM 42X72 X-RAY (DRAPES) ×2 IMPLANT
DRAPE POUCH INSTRU U-SHP 10X18 (DRAPES) ×2 IMPLANT
DRAPE SURG 17X23 STRL (DRAPES) ×2 IMPLANT
DRAPE U-SHAPE 47X51 STRL (DRAPES) ×2 IMPLANT
DRILL BIT SKYLINE 14MM (BIT) ×1
DRSG MEPILEX BORDER 4X4 (GAUZE/BANDAGES/DRESSINGS) ×2 IMPLANT
DURAPREP 26ML APPLICATOR (WOUND CARE) ×2 IMPLANT
ELECT COATED BLADE 2.86 ST (ELECTRODE) ×2 IMPLANT
ELECT PENCIL ROCKER SW 15FT (MISCELLANEOUS) ×2 IMPLANT
ELECT REM PT RETURN 9FT ADLT (ELECTROSURGICAL) ×2
ELECTRODE REM PT RTRN 9FT ADLT (ELECTROSURGICAL) ×1 IMPLANT
ENDOSKELTON TC IMPLANT 8MM MED (Orthopedic Implant) ×2 IMPLANT
GLOVE BIOGEL PI IND STRL 8 (GLOVE) ×1 IMPLANT
GLOVE BIOGEL PI IND STRL 8.5 (GLOVE) ×1 IMPLANT
GLOVE BIOGEL PI INDICATOR 8 (GLOVE) ×1
GLOVE BIOGEL PI INDICATOR 8.5 (GLOVE) ×1
GLOVE ORTHO TXT STRL SZ7.5 (GLOVE) ×2 IMPLANT
GLOVE SS BIOGEL STRL SZ 8.5 (GLOVE) ×1 IMPLANT
GLOVE SUPERSENSE BIOGEL SZ 8.5 (GLOVE) ×1
GOWN STRL REUS W/ TWL XL LVL3 (GOWN DISPOSABLE) ×1 IMPLANT
GOWN STRL REUS W/TWL 2XL LVL3 (GOWN DISPOSABLE) ×4 IMPLANT
GOWN STRL REUS W/TWL XL LVL3 (GOWN DISPOSABLE) ×1
KIT BASIN OR (CUSTOM PROCEDURE TRAY) ×2 IMPLANT
KIT ROOM TURNOVER OR (KITS) ×2 IMPLANT
NEEDLE SPNL 18GX3.5 QUINCKE PK (NEEDLE) ×2 IMPLANT
NS IRRIG 1000ML POUR BTL (IV SOLUTION) ×2 IMPLANT
PACK ORTHO CERVICAL (CUSTOM PROCEDURE TRAY) ×2 IMPLANT
PACK UNIVERSAL I (CUSTOM PROCEDURE TRAY) ×2 IMPLANT
PAD ARMBOARD 7.5X6 YLW CONV (MISCELLANEOUS) ×4 IMPLANT
PATTIES SURGICAL .25X.25 (GAUZE/BANDAGES/DRESSINGS) IMPLANT
PIN DISTRACTION 14 (PIN) ×2 IMPLANT
PIN RETAINER PRODISC 14 MM (PIN) ×2 IMPLANT
PLATE SKYLINE 12MM (Plate) ×2 IMPLANT
PUTTY BONE DBX 2.5 MIS (Bone Implant) ×2 IMPLANT
RESTRAINT LIMB HOLDER UNIV (RESTRAINTS) ×2 IMPLANT
SCREW SKYLINE 14MM SD-VA (Screw) ×8 IMPLANT
SPONGE INTESTINAL PEANUT (DISPOSABLE) ×2 IMPLANT
SPONGE SURGIFOAM ABS GEL 100 (HEMOSTASIS) ×2 IMPLANT
SURGIFLO TRUKIT (HEMOSTASIS) IMPLANT
SUT BONE WAX W31G (SUTURE) ×2 IMPLANT
SUT MON AB 3-0 SH 27 (SUTURE) ×1
SUT MON AB 3-0 SH27 (SUTURE) ×1 IMPLANT
SUT SILK 2 0 (SUTURE)
SUT SILK 2-0 18XBRD TIE 12 (SUTURE) IMPLANT
SUT VIC AB 2-0 CT1 18 (SUTURE) ×2 IMPLANT
SYR BULB IRRIGATION 50ML (SYRINGE) ×2 IMPLANT
SYR CONTROL 10ML LL (SYRINGE) ×2 IMPLANT
TAPE CLOTH 4X10 WHT NS (GAUZE/BANDAGES/DRESSINGS) ×2 IMPLANT
TAPE UMBILICAL COTTON 1/8X30 (MISCELLANEOUS) ×2 IMPLANT
TOWEL OR 17X24 6PK STRL BLUE (TOWEL DISPOSABLE) ×2 IMPLANT
TOWEL OR 17X26 10 PK STRL BLUE (TOWEL DISPOSABLE) ×2 IMPLANT
WATER STERILE IRR 1000ML POUR (IV SOLUTION) ×2 IMPLANT

## 2014-05-15 NOTE — Anesthesia Procedure Notes (Signed)
Procedure Name: Intubation Date/Time: 05/15/2014 7:39 AM Performed by: Gershon Mussel, Cyani Kallstrom Pre-anesthesia Checklist: Patient identified, Patient being monitored, Timeout performed, Emergency Drugs available and Suction available Patient Re-evaluated:Patient Re-evaluated prior to inductionOxygen Delivery Method: Circle System Utilized Preoxygenation: Pre-oxygenation with 100% oxygen Intubation Type: IV induction Ventilation: Mask ventilation without difficulty Laryngoscope Size: Miller and 2 Grade View: Grade I Tube type: Oral Tube size: 7.0 mm Number of attempts: 1 Airway Equipment and Method: Stylet Placement Confirmation: ETT inserted through vocal cords under direct vision,  positive ETCO2 and breath sounds checked- equal and bilateral Secured at: 21 cm Tube secured with: Tape Dental Injury: Teeth and Oropharynx as per pre-operative assessment

## 2014-05-15 NOTE — Brief Op Note (Signed)
05/15/2014  10:40 AM  PATIENT:  Nicole Pacheco  49 y.o. female  PRE-OPERATIVE DIAGNOSIS:  C-6 C-7 HNP RIGHT SIDE   POST-OPERATIVE DIAGNOSIS:  C-6 C-7 HNP RIGHT SIDE   PROCEDURE:  Procedure(s): ANTERIOR CERVICAL DECOMPRESSION/DISCECTOMY FUSION C6-C7   ( 1 LEVEL) (Right)  SURGEON:  Surgeon(s) and Role:    * Melina Schools, MD - Primary  PHYSICIAN ASSISTANT:   ASSISTANTS: none   ANESTHESIA:   general  EBL:  Total I/O In: 1500 [I.V.:1500] Out: -   BLOOD ADMINISTERED:none  DRAINS: none   LOCAL MEDICATIONS USED:  MARCAINE     SPECIMEN:  No Specimen  DISPOSITION OF SPECIMEN:  N/A  COUNTS:  YES  TOURNIQUET:  * No tourniquets in log *  DICTATION: .Other Dictation: Dictation Number [id number]  PLAN OF CARE: Admit for overnight observation  PATIENT DISPOSITION:  PACU - hemodynamically stable.

## 2014-05-15 NOTE — H&P (Signed)
History of Present Illness  The patient is a 49 year old female who comes in today for a preoperative History and Physical. The patient is scheduled for a ACDF C6-7 to be performed by Dr. Duane Lope D. Rolena Infante, MD at [facility] on 05-15-14 . Please see the hospital record for complete dictated history and physical.  Allergies  No Known Drug Allergies09/22/2012  Family History  Cancer grandmother fathers side Cerebrovascular Accident grandfather mothers side  Social History  Living situation live with spouse Current work status working full time Tobacco use current every day smoker; smoke(d) 3 or more pack(s) per day Marital status married Exercise Exercises weekly; does running / walking Drug/Alcohol Rehab (Currently) no Illicit drug use no Most recent primary occupation [employer] / smoke exposure yes outdoors only Previously in rehab no Number of flights of stairs before winded 2-3 Children 1 Alcohol use current drinker; only occasionally per week Pain Contract no  Medication History TraMADol HCl (50MG  Tablet, 1 (one) Oral three times daily, as needed, Taken starting 04/01/2014) Inactive. (DDB/SMT 04/01/14) Vitamin D (Oral) Specific dose unknown - Active. Medications Reconciled  Past Surgical History Cesarean Delivery 1 time  Other Problems Kidney Stone  Vitals  05/13/2014 7:54 AM Weight: 300 lb Height: 67in Body Surface Area: 2.4 m Body Mass Index: 46.99 kg/m  Temp.: 98.62F(Oral)  Pulse: 80 (Regular)  BP: 153/97 (Sitting, Left Arm, Standard)  Physical Exam  General General Appearance-Not in acute distress. Orientation-Oriented X3. Build & Nutrition-Well nourished and Well developed.  Integumentary General Characteristics Surgical Scars - no surgical scar evidence of previous cervical surgery. Cervical Spine-Skin examination of the cervical spine is without deformity, skin lesions, lacerations or  abrasions.  Chest and Lung Exam Chest and lung exam reveals -quiet, even and easy respiratory effort with no use of accessory muscles. Auscultation Breath sounds - Normal and Clear.  Cardiovascular Auscultation Rhythm - Regular.  Peripheral Vascular Upper Extremity Palpation - Radial pulse - Bilateral - 2+.  Neurologic Sensation Upper Extremity - sensation is diminished to light touch in the upper extremity(C7 distribution right side). Reflexes Biceps Reflex - Bilateral - 2+. Brachioradialis Reflex - Bilateral - 2+. Triceps Reflex - Bilateral - 2+. Hoffman's Sign - Bilateral - Hoffman's sign not present.  Musculoskeletal Spine/Ribs/Pelvis  Cervical Spine : Inspection and Palpation - Tenderness - right cervical paraspinals tender to palpation and right trapezius tender to palpation. Strength and Tone: Strength - Deltoid - Bilateral - 5/5. Biceps - Bilateral - 5/5. Triceps - Bilateral - 5/5. Wrist Extension - Bilateral - 5/5. Hand Grip - Bilateral - 5/5. Heel walk - Bilateral - able to heel walk without difficulty. Toe Walk - Bilateral - able to walk on toes without difficulty. Heel-Toe Walk - Bilateral - able to heel-toe walk without difficulty. ROM - Flexion - Mildly decreased and painful. Extension - Mildly decreased and painful. Left Lateral Flexion - Mildly decreased. Right Lateral Flexion - Mildly decreased and painful. Left Rotation - Full. Right Rotation - Full. Pain - neither flexion or extension is more painful than the other. Cervical Spine - Special Testing - axial compression test negative, cross chest impingement test negative. Non-Anatomic Signs - No non-anatomic signs present. Upper Extremity Range of Motion - No truesholder pain with IR/ER of the shoulders.  Assessment & Plan  Current Plans Anterior cervical fusion:Risks of surgery include, but are not limited to: Throat pain, swallowing difficulty, hoarseness or change in voice, death, stroke, paralysis, nerve root  damage/injury, bleeding, blood clots, loss  of bowel/bladder control, hardware failure, or mal-position, spinal fluid leak, adjacent segment disease, non-union, need for further surgery, ongoing or worse pain, infection. Post-operative bleeding or swelling that could require emergent surgery. Goal Of Surgery:Discussed that goal of surgery is to reduce pain and improve function and quality of life. Patient is aware that despite all appropriate treatment that there pain and function could be the same, worse, or different.  Plans Transcription At this point in time, she continues to have severe debilitating neck and radicular right arm pain and numbness in the C7 distribution. She does not have any focal motor deficits, but the quality of her life is to the point where she is not doing well. Despite physical therapy, injection therapy, medications, activity modifications, she continues to deteriorate. As a result of the failure of said conservative management, we have elected to proceed with an anterior cervical discectomy and fusion at C6-C7. We have reviewed all of the risks and benefits today. We have gone over the surgical procedure. All of her and her husband's questions have been addressed.

## 2014-05-15 NOTE — Anesthesia Postprocedure Evaluation (Signed)
  Anesthesia Post-op Note  Patient: Nicole Pacheco  Procedure(s) Performed: Procedure(s): ANTERIOR CERVICAL DECOMPRESSION/DISCECTOMY FUSION C6-C7   ( 1 LEVEL) (Right)  Patient Location: PACU  Anesthesia Type:General  Level of Consciousness: awake, alert , oriented and patient cooperative  Airway and Oxygen Therapy: Patient Spontanous Breathing and Patient connected to nasal cannula oxygen  Post-op Pain: mild  Post-op Assessment: Post-op Vital signs reviewed, Patient's Cardiovascular Status Stable, Respiratory Function Stable, Patent Airway, No signs of Nausea or vomiting, Adequate PO intake and Pain level controlled  Post-op Vital Signs: Reviewed and stable  Last Vitals:  Filed Vitals:   05/15/14 1100  BP: 148/94  Pulse: 57  Temp:   Resp: 10    Complications: No apparent anesthesia complications

## 2014-05-15 NOTE — Transfer of Care (Signed)
Immediate Anesthesia Transfer of Care Note  Patient: Nicole Pacheco  Procedure(s) Performed: Procedure(s): ANTERIOR CERVICAL DECOMPRESSION/DISCECTOMY FUSION C6-C7   ( 1 LEVEL) (Right)  Patient Location: PACU  Anesthesia Type:General  Level of Consciousness: awake, alert , oriented and patient cooperative  Airway & Oxygen Therapy: Patient Spontanous Breathing and Patient connected to face mask oxygen  Post-op Assessment: Report given to RN, Post -op Vital signs reviewed and stable and Patient moving all extremities X 4  Post vital signs: Reviewed and stable  Last Vitals:  Filed Vitals:   05/15/14 0655  BP: 162/83  Pulse: 72  Temp: 36.9 C  Resp: 18    Complications: No apparent anesthesia complications

## 2014-05-15 NOTE — Discharge Instructions (Signed)

## 2014-05-15 NOTE — Anesthesia Preprocedure Evaluation (Addendum)
Anesthesia Evaluation  Patient identified by MRN, date of birth, ID band Patient awake    Reviewed: Allergy & Precautions, NPO status , Patient's Chart, lab work & pertinent test results  History of Anesthesia Complications Negative for: history of anesthetic complications  Airway Mallampati: II       Dental  (+) Teeth Intact, Dental Advisory Given   Pulmonary COPDCurrent Smoker,  breath sounds clear to auscultation        Cardiovascular - anginanegative cardio ROS  Rhythm:Regular Rate:Normal     Neuro/Psych    GI/Hepatic Neg liver ROS, GERD-  Poorly Controlled,  Endo/Other  Morbid obesity  Renal/GU negative Renal ROS     Musculoskeletal   Abdominal (+) + obese,   Peds  Hematology negative hematology ROS (+)   Anesthesia Other Findings   Reproductive/Obstetrics IUD, preg test NEG                            Anesthesia Physical Anesthesia Plan  ASA: III  Anesthesia Plan: General   Post-op Pain Management:    Induction: Intravenous  Airway Management Planned: Oral ETT  Additional Equipment:   Intra-op Plan:   Post-operative Plan: Extubation in OR  Informed Consent: I have reviewed the patients History and Physical, chart, labs and discussed the procedure including the risks, benefits and alternatives for the proposed anesthesia with the patient or authorized representative who has indicated his/her understanding and acceptance.   Dental advisory given  Plan Discussed with: Surgeon and CRNA  Anesthesia Plan Comments: (Plan routine monitors, GETA)        Anesthesia Quick Evaluation

## 2014-05-16 ENCOUNTER — Encounter (HOSPITAL_COMMUNITY): Payer: Self-pay | Admitting: Orthopedic Surgery

## 2014-05-16 DIAGNOSIS — M5022 Other cervical disc displacement, mid-cervical region: Secondary | ICD-10-CM | POA: Diagnosis not present

## 2014-05-16 NOTE — Discharge Summary (Signed)
Patient ID: Nicole Pacheco MRN: [id number] DOB/AGE: May 20, 1965 49 y.o.  Admit date: 05/15/2014 Discharge date: 05/16/2014  Admission Diagnoses:  Active Problems:   Neck pain   Discharge Diagnoses:  Active Problems:   Neck pain  status post Procedure(s): ANTERIOR CERVICAL DECOMPRESSION/DISCECTOMY FUSION C6-C7   ( 1 LEVEL)  Past Medical History  Diagnosis Date  . History of kidney stones   . GERD (gastroesophageal reflux disease)     tums occ    Surgeries: Procedure(s): ANTERIOR CERVICAL DECOMPRESSION/DISCECTOMY FUSION C6-C7   ( 1 LEVEL) on 05/15/2014   Consultants:    Discharged Condition: Improved  Hospital Course: Nicole Pacheco is an 49 y.o. female who was admitted 05/15/2014 for operative treatment of C6/7 HNP. Patient failed conservative treatments (please see the history and physical for the specifics) and had severe unremitting pain that affects sleep, daily activities and work/hobbies. After pre-op clearance, the patient was taken to the operating room on 05/15/2014 and underwent  Procedure(s): ANTERIOR CERVICAL DECOMPRESSION/DISCECTOMY FUSION C6-C7   ( 1 LEVEL).    Patient was given perioperative antibiotics: Anti-infectives    Start     Dose/Rate Route Frequency Ordered Stop   05/15/14 1600  ceFAZolin (ANCEF) IVPB 1 g/50 mL premix     1 g 100 mL/hr over 30 Minutes Intravenous Every 8 hours 05/15/14 1355 05/16/14 0046   05/15/14 0700  ceFAZolin (ANCEF) 3 g in dextrose 5 % 50 mL IVPB  Status:  Discontinued     3 g 160 mL/hr over 30 Minutes Intravenous To Surgery 05/14/14 1341 05/15/14 1315       Patient was given sequential compression devices and early ambulation to prevent DVT.   Patient benefited maximally from hospital stay and there were no complications. At the time of discharge, the patient was urinating/moving their bowels without difficulty, tolerating a regular diet, pain is controlled with oral pain medications and they have been cleared by PT/OT.     Recent vital signs: Patient Vitals for the past 24 hrs:  BP Temp Temp src Pulse Resp SpO2  05/16/14 0512 (!) 115/57 mmHg 98.7 F (37.1 C) Oral 70 17 97 %  05/15/14 2020 135/66 mmHg 98.7 F (37.1 C) Oral 83 19 94 %  05/15/14 1323 133/82 mmHg 98.1 F (36.7 C) Oral 88 18 98 %  05/15/14 1303 - 98.1 F (36.7 C) - - - -  05/15/14 1245 (!) 155/91 mmHg - - 75 (!) 21 98 %  05/15/14 1215 - 97.2 F (36.2 C) - - - -  05/15/14 1203 (!) 155/93 mmHg - - 70 14 (!) 89 %  05/15/14 1131 (!) 150/92 mmHg - - 79 (!) 21 98 %  05/15/14 1116 (!) 142/88 mmHg - - 67 15 92 %  05/15/14 1100 (!) 148/94 mmHg - - (!) 57 10 100 %  05/15/14 1045 (!) 144/90 mmHg 97 F (36.1 C) - 82 13 99 %     Recent laboratory studies:  Recent Labs  05/13/14 1431  WBC 8.8  HGB 13.2  HCT 39.2  PLT 247  NA 139  K 4.0  CL 106  CO2 24  BUN 11  CREATININE 0.68  GLUCOSE 88  CALCIUM 9.0     Discharge Medications:     Medication List    STOP taking these medications        acetaminophen 325 MG tablet  Commonly known as:  TYLENOL     ibuprofen 200 MG tablet  Commonly known as:  ADVIL,MOTRIN  traMADol 50 MG tablet  Commonly known as:  ULTRAM      TAKE these medications        levonorgestrel 20 MCG/24HR IUD  Commonly known as:  MIRENA  1 each by Intrauterine route once.     methocarbamol 500 MG tablet  Commonly known as:  ROBAXIN  Take 1 tablet (500 mg total) by mouth 3 (three) times daily as needed for muscle spasms.     ondansetron 4 MG tablet  Commonly known as:  ZOFRAN  Take 1 tablet (4 mg total) by mouth every 8 (eight) hours as needed for nausea or vomiting.     oxyCODONE-acetaminophen 10-325 MG per tablet  Commonly known as:  PERCOCET  Take 1 tablet by mouth every 4 (four) hours as needed for pain.     VITAMIN D PO  Take 1 tablet by mouth daily.        Diagnostic Studies: Dg Cervical Spine 2 Or 3 Views  05/15/2014   CLINICAL DATA:  Neck pain.  EXAM: CERVICAL SPINE - 2-3 VIEW   COMPARISON:  Intraoperative radiographs dated 05/15/2014  FINDINGS: AP and lateral views demonstrate the patient has undergone anterior cervical fusion at C6-7. The level of fusion is not visible on the lateral view due to the patient's shoulders.  IMPRESSION: Anterior fusion at C6-7. The lateral view does not visualize the surgical level.   Electronically Signed   By: Lorriane Shire M.D.   On: 05/15/2014 12:52   Dg Cervical Spine Complete  05/15/2014   CLINICAL DATA:  CERVICAL DISC DISEASE.  EXAM: CERVICAL SPINE  2 VIEWS, C-ARM IMAGING 61-120 MIN  COMPARISON:  None.  FINDINGS: Multiple C-arm images in the AP and lateral projection demonstrate the patient has undergone anterior cervical fusion at C6-7.  IMPRESSION: Anterior cervical fusion performed at C6-7.   Electronically Signed   By: Lorriane Shire M.D.   On: 05/15/2014 11:03   Dg C-arm 1-60 Min  05/15/2014   CLINICAL DATA:  CERVICAL DISC DISEASE.  EXAM: CERVICAL SPINE  2 VIEWS, C-ARM IMAGING 61-120 MIN  COMPARISON:  None.  FINDINGS: Multiple C-arm images in the AP and lateral projection demonstrate the patient has undergone anterior cervical fusion at C6-7.  IMPRESSION: Anterior cervical fusion performed at C6-7.   Electronically Signed   By: Lorriane Shire M.D.   On: 05/15/2014 11:03          Follow-up Information    Follow up with Dahlia Bailiff, MD. Schedule an appointment as soon as possible for a visit in 2 weeks.   Specialty:  Orthopedic Surgery   Why:  If symptoms worsen, For suture removal, For wound re-check   Contact information:   [address] [place] [address] [phone]       Discharge Plan:  discharge to home  Disposition: hospital course uneventful.  Radicular arm pain resolved. Tolerating regular diet and ambulating F/U in 2 weeks    Signed: Melina Schools D for Dr. Melina Schools [facility] [phone] 05/16/2014, 7:21 AM

## 2014-05-16 NOTE — Care Management Note (Signed)
CARE MANAGEMENT NOTE 05/16/2014  Patient:  Nicole Pacheco, Nicole Pacheco   Account Number:  [id number]  Date Initiated:  05/16/2014  Documentation initiated by:  Ricki Miller  Subjective/Objective Assessment:   49 yr old female admitted with neck pain. Patient underwent C6-7 ACDF.     Action/Plan:   Patient has no home health or DME needs identified.   Anticipated DC Date:  05/16/2014   Anticipated DC Plan:  HOME/SELF CARE      DC Planning Services  CM consult      Choice offered to / List presented to:     DME arranged  NA        HH arranged  NA      Status of service:  Completed, signed off Medicare Important Message given?   (If response is "NO", the following Medicare IM given date fields will be blank) Date Medicare IM given:   Medicare IM given by:   Date Additional Medicare IM given:   Additional Medicare IM given by:    Discharge Disposition:  HOME/SELF CARE  Per UR Regulation:  Reviewed for med. necessity/level of care/duration of stay  If discussed at [place] of Stay Meetings, dates discussed:    Comments:

## 2014-05-16 NOTE — Progress Notes (Signed)
Per OT - patient may walk in the hallway independently w/o assist.

## 2014-05-16 NOTE — Progress Notes (Signed)
    Subjective: Procedure(s) (LRB): ANTERIOR CERVICAL DECOMPRESSION/DISCECTOMY FUSION C6-C7   ( 1 LEVEL) (Right) 1 Day Post-Op  Patient reports pain as 1 on 0-10 scale.  Reports decreased arm pain reports incisional neck pain   Positive void Negative bowel movement Positive flatus Negative chest pain or shortness of breath  Objective: Vital signs in last 24 hours: Temp:  [97 F (36.1 C)-98.7 F (37.1 C)] 98.7 F (37.1 C) (04/29 0512) Pulse Rate:  [57-88] 70 (04/29 0512) Resp:  [10-21] 17 (04/29 0512) BP: (115-155)/(57-94) 115/57 mmHg (04/29 0512) SpO2:  [89 %-100 %] 97 % (04/29 0512)  Intake/Output from previous day: 04/28 0701 - 04/29 0700 In: 2560 [P.O.:560; I.V.:2000] Out: 30 [Blood:30]  Labs:  Recent Labs  05/13/14 1431  WBC 8.8  RBC 4.59  HCT 39.2  PLT 247    Recent Labs  05/13/14 1431  NA 139  K 4.0  CL 106  CO2 24  BUN 11  CREATININE 0.68  GLUCOSE 88  CALCIUM 9.0   No results for input(s): LABPT, INR in the last 72 hours.  Physical Exam: Neurologically intact ABD soft Intact pulses distally Incision: dressing C/D/I and no drainage Compartment soft  Assessment/Plan: Patient stable  xrays cant visualize plate on lateral due to body habitus.   Mobilization with physical therapy Encourage incentive spirometry Continue care  Advance diet Up with therapy  Doing well.  Radicular arm pain resolved Plan on d/c to home today  Melina Schools, MD [place] [phone]

## 2014-05-16 NOTE — Evaluation (Signed)
Occupational Therapy Evaluation Patient Details Name: Nicole Pacheco MRN: [id number] DOB: 02-Apr-1965 Today's Date: 05/16/2014    History of Present Illness 49 yo Female s/p ANTERIOR CERVICAL DECOMPRESSION/DISCECTOMY FUSION C6-C7   Clinical Impression   Patient admitted with above. Patient independent PTA. Patient currently functioning at an overall mod I level. D/C from acute OT services and no additional follow-up OT needs at this time. All appropriate education provided to patient. Please re-order OT if needed.      Follow Up Recommendations  No OT follow up;Supervision - Intermittent    Equipment Recommendations  None recommended by OT    Recommendations for Other Services  None at this time   Precautions / Restrictions Precautions Precautions: Cervical Precaution Comments: Reviewed precautions with patient  Required Braces or Orthoses: Cervical Brace Cervical Brace: Hard collar (per patient report, on during long distance mobility, riding in car, sitting on low surfaces. Off while sleeping, sitting in high back chair, and short distance ambulation (<> BR) ) Restrictions Weight Bearing Restrictions: No      Mobility Bed Mobility General bed mobility comments: Patient found seated in recliner, please see PT note for more details.   Transfers Overall transfer level: Modified independent General transfer comment: extra time, no cues needed    Balance Overall balance assessment: No apparent balance deficits (not formally assessed)    ADL Overall ADL's : Modified independent General ADL Comments: Patient overall mod I for functional mobility/transfers and self-care tasks. Patient presents with good safety awareness during ADL and mobility. Patient states her husband has a 3-in-1 at home, encouraged her to use this over her standard toilet seats and in shower prn. Discussed importance of energy conservation post acute d/c. Patient states her husband will be available prn and  her daughter will be in town this weekend from college.     Pertinent Vitals/Pain Pain Assessment: No/denies pain     Hand Dominance Right   Extremity/Trunk Assessment Upper Extremity Assessment Upper Extremity Assessment: Overall WFL for tasks assessed (Pt with complaints of numbness/tingling radial aspect of left hand. Patient reports this has been getting resolving and is better than it was yesterday. )   Lower Extremity Assessment Lower Extremity Assessment: Defer to PT evaluation   Cervical / Trunk Assessment Cervical / Trunk Assessment: Kyphotic   Communication Communication Communication: No difficulties   Cognition Arousal/Alertness: Awake/alert Behavior During Therapy: WFL for tasks assessed/performed Overall Cognitive Status: Within Functional Limits for tasks assessed              Home Living Family/patient expects to be discharged to:: Private residence Living Arrangements: Spouse/significant other Available Help at Discharge: Family;Available 24 hours/day Type of Home: House Home Access: Stairs to enter [employer] of Steps: 2 Entrance Stairs-Rails: Can reach both Home Layout: Two level;Able to live on main level with bedroom/bathroom     Bathroom Shower/Tub: Tub/shower unit;Curtain   Bathroom Toilet: Standard     Home Equipment: None    Prior Functioning/Environment Level of Independence: Independent      OT Diagnosis:  n/a, no acute OT needs identified    OT Problem List:   n/a, no acute OT needs identified   OT Treatment/Interventions:   n/a, no acute OT needs identified   OT Goals(Current goals can be found in the care plan section) Acute Rehab OT Goals Patient Stated Goal: go home today  OT Frequency:   n/a, no acute OT needs identified   Barriers to D/C:  None known at this time  End of Session Equipment Utilized During Treatment: Cervical collar Nurse Communication: Mobility status (independent in room and  hallway)  Activity Tolerance: Patient tolerated treatment well Patient left: in chair;with call bell/phone within reach   Time: 0816-0833 OT Time Calculation (min): 17 min Charges:  OT General Charges $OT Visit: 1 Procedure OT Evaluation $Initial OT Evaluation Tier I: 1 Procedure  Aleric Froelich , MS, OTR/L, CLT Pager: [phone]  05/16/2014, 8:42 AM

## 2014-05-16 NOTE — Progress Notes (Signed)
PT Cancellation Note  Patient Details Name: Nicole Pacheco MRN: [id number] DOB: November 23, 1965   Cancelled Treatment:    Reason Eval/Treat Not Completed: PT screened, no needs identified, will sign off. Pt seen by OT already for education regarding precautions.  Pt ambulating with OT at MOD/I level and did stairs as well.  Feel a PT eval not warranted at this time and will sign off. Thank you for the referral.   Christyann Manolis LUBECK 05/16/2014, 9:00 AM

## 2014-05-16 NOTE — Progress Notes (Signed)
Rx orders require reconciliation prior to pharmacy releasing medications to print d/c AVS instructions. Called earlier [1.5 - 2 hours ago] talked with office staff with request - Dr Rolena Infante reconcile medications - unable to print and provide d/c instructions. Stated would be taken care of. Repeat request left via required voice mail about 10-15 minutes ago. Still unable to print AVS.

## 2014-05-16 NOTE — Op Note (Signed)
Nicole Pacheco, PICKING NO.:  [id number]  MEDICAL RECORD NO.:  [id number]  LOCATION:  [id number]                        FACILITY:  [facility]  PHYSICIAN:  Tunisha Ruland D. Rolena Infante, M.D. 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  OPERATIVE PROCEDURE:  Anterior cervical diskectomy and fusion.  INTRAOPERATIVE FINDINGS:  Large posterolateral to the right disk herniation consistent with what was seen on MRI.  This was excised as well with the posterior longitudinal ligament.  This allowed significant decompression of the thecal sac and nerve root.  No other complicating features.  Instrumentation system used was a size 8 medium Titan titanium lordotic cage packed with DBX mix along with the DePuy anterior cervical skyline plate with 14 mm locking screws.  HISTORY:  This is a very pleasant 49 year old woman, who has been having significant neck and radicular right arm pain.  Attempts at conservative management have failed to alleviate her symptoms such we elected to proceed with surgery.  All appropriate risks, benefits, and alternatives were discussed with the patient and consent was obtained.  OPERATIVE NOTE:  The patient was brought to the operating room, placed supine on the operating table.  After successful induction of general anesthesia and endotracheal intubation, TEDs, SCDs were applied. Inflatable cuff was placed behind the shoulder blades and wrist restraints were placed for retraction.  The anterior cervical spine was prepped and draped in a standard fashion.  Time-out was taken to confirm the patient procedure and all other pertinent important data.  Once this was completed,  I then used x-ray to identify the C6-7 level.  Because of her stout neck and obesity, it was difficult imaging.  I was able to localize myself over the cricoid cartilage and infiltrated a transverse incision.  This was infiltrated with 0.25% Marcaine.  A transverse incision was made starting at the midline and proceeding to the left.  Sharp dissection was carried out down to the platysma.  The platysma was sharply incised and I continued with a standard Smith-Robinson approach.  The deep cervical fascia was sharply dissected through staying along the medial border of the sternocleidomastoid.  I identified the omohyoid and released it from the sling inferiorly.  I continued to dissect in the deep cervical and prevertebral fascia sweeping the esophagus to the right and protecting it with an appendiceal retractor, palpating and protecting the carotid artery to laterally.  Once I was through the deep cervical and prevertebral fascia and onto the anterior longitudinal ligament, I then switched to Kittner dissectors and removed the remaining fascia to expose the C6-7 disk space.  Once I had an adequate exposure, I then took an x-ray.  Again because of her body habitus, it was quite difficult, so I actually identified the C5-6 disk space and I confirmed the C6 vertebral body and then marked the C6-7 disk space.  I then mobilized the longus colli muscle using bipolar electrocautery from the midbody of 6 to  the midbody of 7.  Self-retaining retractor and Caspar retractor blades were placed underneath the longus colli muscle.  The endotracheal cuff was deflated.  I expanded the retractor and reinflated the cuff.  Once 2 retractors were in place, I continued my decompression.  Using a double-action Leksell rongeur, I removed the significant anterior osteophyte.  I trimmed down to the psoas level to the anterior surface of the vertebral body.  An annulotomy was then performed with a 15 blade scalpel  and then using a combination of pituitary rongeurs, curettes, and Kerrison rongeurs, I removed all of the disk material down to the posterior aspect.  I then placed 2 distraction pins one into 7, one into 6, distracted the intervertebral space and then maintained it with the distraction pins.  I then used fine nerve curette to continue to remove the posterior annulus and disk material.  Once I was down to the posterior longitudinal ligament and posterior annulus, I then used my fine nerve hook to develop a plane underneath the PLL and then used my 1 mm Kerrison to resect the PLL. Once the PLL was resected towards the left-hand corner, I could now work towards the right where the disk herniation was.  I was able to get my nerve hook underneath the disk fragment and proceeded into the intervertebral space.  I then removed it with a micropituitary rongeur. There was 2 large fragments of disk material.  At this point, I could actually see the thecal sac on that right side and elevate backup.  I then continued to remove the posterior longitudinal ligament over in towards the right hand side.  At this point, I could now sweep underneath the posterior longitudinal ligament circumferentially at the level of the disk space and confirming that all free fragments of disk were removed.  I then rasped the endplates, irrigated the wound copiously with normal saline and measured with trial devices.  I then obtained a size 8 medium Titan titanium lordotic cage packed with DBX mix.  I then malleted this into the disk space and I had an excellent fit.  I then removed the distraction pins and applied a size 12 mm anterior cervical plate.  This was affixed with self-drilling 14 mm screws 2 into the body of 6, 2 into the body of 7.  All screws had excellent purchase.  At this point, I irrigated the wound copiously with normal saline, and once I was sure that I had all screws were appropriately tight, I then  locked them in place according to manufacturer standards.  Once this was done, I then removed the retracting devices and irrigated the wound copiously with normal saline. I made sure I had hemostasis using bipolar electrocautery.  Once this was done, I then checked again to ensure the esophagus was not entrapped beneath the plate which it was not.  I then returned the trachea and esophagus to midline and then closed the platysma with interrupted 2-0 Vicryl sutures and then superficial with 3-0 Monocryl.  Steri-Strips and a dry dressing were applied.  The patient was ultimately extubated, transferred to PACU without incident.     Nashira Mcglynn D. Rolena Infante, M.D.     DDB/MEDQ  D:  05/15/2014  T:  05/16/2014  Job:  [id number]

## 2015-01-18 HISTORY — PX: IUD REMOVAL: SHX5392

## 2015-03-13 ENCOUNTER — Other Ambulatory Visit: Payer: Self-pay | Admitting: Physician Assistant

## 2015-03-13 DIAGNOSIS — R7989 Other specified abnormal findings of blood chemistry: Secondary | ICD-10-CM

## 2015-03-13 DIAGNOSIS — R945 Abnormal results of liver function studies: Principal | ICD-10-CM

## 2015-03-19 ENCOUNTER — Other Ambulatory Visit: Payer: Self-pay | Admitting: Gastroenterology

## 2015-03-25 ENCOUNTER — Ambulatory Visit (HOSPITAL_COMMUNITY): Admission: RE | Admit: 2015-03-25 | Payer: 59 | Source: Ambulatory Visit | Admitting: Gastroenterology

## 2015-03-25 ENCOUNTER — Ambulatory Visit
Admission: RE | Admit: 2015-03-25 | Discharge: 2015-03-25 | Disposition: A | Discharge status: Home / Self Care | Payer: 59 | Source: Ambulatory Visit | Attending: Physician Assistant | Admitting: Physician Assistant

## 2015-03-25 DIAGNOSIS — R7989 Other specified abnormal findings of blood chemistry: Secondary | ICD-10-CM

## 2015-03-25 DIAGNOSIS — R945 Abnormal results of liver function studies: Principal | ICD-10-CM

## 2015-03-25 SURGERY — COLONOSCOPY WITH PROPOFOL
Anesthesia: Monitor Anesthesia Care

## 2015-04-20 ENCOUNTER — Encounter (HOSPITAL_COMMUNITY): Payer: Self-pay | Admitting: *Deleted

## 2015-04-27 ENCOUNTER — Other Ambulatory Visit: Payer: Self-pay | Admitting: Gastroenterology

## 2015-04-30 ENCOUNTER — Encounter (HOSPITAL_COMMUNITY)
Admission: RE | Disposition: A | Discharge status: Home / Self Care | Payer: Self-pay | Source: Ambulatory Visit | Attending: Gastroenterology

## 2015-04-30 ENCOUNTER — Ambulatory Visit (HOSPITAL_COMMUNITY)
Admission: RE | Admit: 2015-04-30 | Discharge: 2015-04-30 | Disposition: A | Discharge status: Home / Self Care | Payer: 59 | Source: Ambulatory Visit | Attending: Gastroenterology | Admitting: Gastroenterology

## 2015-04-30 ENCOUNTER — Encounter (HOSPITAL_COMMUNITY): Payer: Self-pay | Admitting: Certified Registered"

## 2015-04-30 ENCOUNTER — Ambulatory Visit (HOSPITAL_COMMUNITY): Payer: 59 | Admitting: Certified Registered"

## 2015-04-30 DIAGNOSIS — K648 Other hemorrhoids: Secondary | ICD-10-CM | POA: Diagnosis not present

## 2015-04-30 DIAGNOSIS — K6289 Other specified diseases of anus and rectum: Secondary | ICD-10-CM | POA: Diagnosis not present

## 2015-04-30 DIAGNOSIS — Z6841 Body Mass Index (BMI) 40.0 and over, adult: Secondary | ICD-10-CM | POA: Diagnosis not present

## 2015-04-30 DIAGNOSIS — D123 Benign neoplasm of transverse colon: Secondary | ICD-10-CM | POA: Diagnosis not present

## 2015-04-30 DIAGNOSIS — K644 Residual hemorrhoidal skin tags: Secondary | ICD-10-CM | POA: Insufficient documentation

## 2015-04-30 DIAGNOSIS — Z1211 Encounter for screening for malignant neoplasm of colon: Secondary | ICD-10-CM | POA: Insufficient documentation

## 2015-04-30 DIAGNOSIS — F17210 Nicotine dependence, cigarettes, uncomplicated: Secondary | ICD-10-CM | POA: Insufficient documentation

## 2015-04-30 DIAGNOSIS — K219 Gastro-esophageal reflux disease without esophagitis: Secondary | ICD-10-CM | POA: Insufficient documentation

## 2015-04-30 HISTORY — PX: COLONOSCOPY WITH PROPOFOL: SHX5780

## 2015-04-30 SURGERY — COLONOSCOPY WITH PROPOFOL
Anesthesia: Monitor Anesthesia Care

## 2015-04-30 MED ORDER — PROPOFOL 10 MG/ML IV BOLUS
INTRAVENOUS | Status: AC
  Filled 2015-04-30: qty 40

## 2015-04-30 MED ORDER — LACTATED RINGERS IV SOLN
INTRAVENOUS | Status: DC
Start: 2015-04-30 — End: 2015-04-30
  Administered 2015-04-30: 1000 mL via INTRAVENOUS

## 2015-04-30 MED ORDER — PROPOFOL 10 MG/ML IV BOLUS
INTRAVENOUS | Status: DC | PRN
  Administered 2015-04-30: 20 mg via INTRAVENOUS
  Administered 2015-04-30: 30 mg via INTRAVENOUS
  Administered 2015-04-30: 20 mg via INTRAVENOUS

## 2015-04-30 MED ORDER — SODIUM CHLORIDE 0.9 % IV SOLN: INTRAVENOUS | Status: DC

## 2015-04-30 MED ORDER — PROPOFOL 500 MG/50ML IV EMUL
INTRAVENOUS | Status: DC | PRN
  Administered 2015-04-30: 100 ug/kg/min via INTRAVENOUS

## 2015-04-30 MED ORDER — PROPOFOL 10 MG/ML IV BOLUS
INTRAVENOUS | Status: AC
  Filled 2015-04-30: qty 20

## 2015-04-30 MED ORDER — LIDOCAINE HCL (CARDIAC) 20 MG/ML IV SOLN
INTRAVENOUS | Status: DC | PRN
  Administered 2015-04-30: 50 mg via INTRAVENOUS

## 2015-04-30 MED ORDER — LIDOCAINE HCL (CARDIAC) 20 MG/ML IV SOLN
INTRAVENOUS | Status: AC
  Filled 2015-04-30: qty 5

## 2015-04-30 SURGICAL SUPPLY — 21 items

## 2015-04-30 NOTE — Transfer of Care (Signed)
Immediate Anesthesia Transfer of Care Note  Patient: Nicole Pacheco  Procedure(s) Performed: Procedure(s): COLONOSCOPY WITH PROPOFOL (N/A)  Patient Location: PACU  Anesthesia Type:MAC  Level of Consciousness: awake, alert  and oriented  Airway & Oxygen Therapy: Patient Spontanous Breathing and Patient connected to face mask oxygen  Post-op Assessment: Report given to RN and Post -op Vital signs reviewed and stable  Post vital signs: Reviewed and stable  Last Vitals:  Filed Vitals:   04/30/15 0722 04/30/15 0727  BP:  129/67  Pulse: 84   Temp: 36.5 C   Resp: 13     Complications: No apparent anesthesia complications

## 2015-04-30 NOTE — Progress Notes (Signed)
Nicole Pacheco 8:47 AM  Subjective: Patient without any GI symptoms and no medical problems and she was seen in our office  Objective: Vital signs stable afebrile no acute distress exam please see preassessment evaluation  Assessment: Colonic Screening  Plan: Okay to proceed with colonoscopy today with anesthesia assistance  [facility] E  Pager [phone] After 5PM or if no answer call [phone]

## 2015-04-30 NOTE — Op Note (Signed)
[facility] Patient Name: Nicole Pacheco Procedure Date: 04/30/2015 MRN: [id number] Attending MD: Clarene Essex , MD Date of Birth: January 23, 1965 CSN:  Age: 50 Admit Type: Outpatient Procedure:                Colonoscopy Indications:              Screening for colorectal malignant neoplasm, This                            is the patient's first colonoscopy Providers:                Clarene Essex, MD, Cleda Daub, RN, William Dalton,                            Technician Referring MD:              Medicines:                Propofol total dose [id number] mg IV Complications:            No immediate complications. Estimated Blood Loss:     Estimated blood loss: none. Procedure:                Pre-Anesthesia Assessment:                           - Prior to the procedure, a History and Physical                            was performed, and patient medications and                            allergies were reviewed. The patient's tolerance of                            previous anesthesia was also reviewed. The risks                            and benefits of the procedure and the sedation                            options and risks were discussed with the patient.                            All questions were answered, and informed consent                            was obtained. Prior Anticoagulants: The patient has                            taken no previous anticoagulant or antiplatelet                            agents. ASA Grade Assessment: III - A patient with  severe systemic disease. After reviewing the risks                            and benefits, the patient was deemed in                            satisfactory condition to undergo the procedure.                           After obtaining informed consent, the colonoscope                            was passed under direct vision. Throughout the                            procedure, the patient's  blood pressure, pulse, and                            oxygen saturations were monitored continuously. The                            [id number]) scope was introduced through                            the anus and advanced to the the cecum, identified                            by appendiceal orifice and ileocecal valve. The                            ileocecal valve, appendiceal orifice, and rectum                            were photographed. The colonoscopy was performed                            without difficulty. The patient tolerated the                            procedure well. The quality of the bowel                            preparation was adequate. Findings:      External and internal hemorrhoids were found during retroflexion, during       perianal exam and during digital exam. The hemorrhoids were small.      Anal papilla(e) were hypertrophied.      Two semi-sessile polyps were found in the mid transverse colon and       distal transverse colon. The polyps were small in size. These polyps       were removed with a hot snare. Resection and retrieval were complete.      The exam was otherwise without abnormality. Impression:               - External and internal hemorrhoids.                           -  Anal papilla(e) were hypertrophied.                           - Two small polyps in the mid transverse colon and                            in the distal transverse colon, removed with a hot                            snare. Resected and retrieved.                           - The examination was otherwise normal. Moderate Sedation:      N/A- Per Anesthesia Care Recommendation:           - Patient has a contact number available for                            emergencies. The signs and symptoms of potential                            delayed complications were discussed with the                            patient. Return to normal activities tomorrow.                             Written discharge instructions were provided to the                            patient.                           - Soft diet today.                           - Continue present medications.                           - Await pathology results.                           - Repeat colonoscopy in 5-10 years for surveillance                            based on pathology results.                           - Return to GI office PRN.                           - Telephone GI clinic for pathology results in 1                            week.                           -  Telephone GI clinic if symptomatic PRN. Procedure Code(s):        --- Professional ---                           [phone], Colonoscopy, flexible; with removal of                            tumor(s), polyp(s), or other lesion(s) by snare                            technique Diagnosis Code(s):        --- Professional ---                           D12.3, Benign neoplasm of transverse colon (hepatic                            flexure or splenic flexure)                           Z12.11, Encounter for screening for malignant                            neoplasm of colon CPT copyright 2016 [employer]. All rights reserved. The codes documented in this report are preliminary and upon coder review may  be revised to meet current compliance requirements. Clarene Essex, MD Clarene Essex, MD 04/30/2015 9:32:52 AM This report has been signed electronically. Number of Addenda: 0

## 2015-04-30 NOTE — Discharge Instructions (Signed)
Colonoscopy, Care After Refer to this sheet in the next few weeks. These instructions provide you with information on caring for yourself after your procedure. Your health care provider may also give you more specific instructions. Your treatment has been planned according to current medical practices, but problems sometimes occur. Call your health care provider if you have any problems or questions after your procedure. WHAT TO EXPECT AFTER THE PROCEDURE  After your procedure, it is typical to have the following:  A small amount of blood in your stool.  Moderate amounts of gas and mild abdominal cramping or bloating. HOME CARE INSTRUCTIONS  Do not drive, operate machinery, or sign important documents for 24 hours.  You may shower and resume your regular physical activities, but move at a slower pace for the first 24 hours.  Take frequent rest periods for the first 24 hours.  Walk around or put a warm pack on your abdomen to help reduce abdominal cramping and bloating.  Drink enough fluids to keep your urine clear or pale yellow.  You may resume your normal diet as instructed by your health care provider. Avoid heavy or fried foods that are hard to digest.  Avoid drinking alcohol for 24 hours or as instructed by your health care provider.  Only take over-the-counter or prescription medicines as directed by your health care provider.  If a tissue sample (biopsy) was taken during your procedure:  Do not take aspirin or blood thinners for 7 days, or as instructed by your health care provider.  Do not drink alcohol for 7 days, or as instructed by your health care provider.  Eat soft foods for the first 24 hours. SEEK MEDICAL CARE IF: You have persistent spotting of blood in your stool 2-3 days after the procedure. SEEK IMMEDIATE MEDICAL CARE IF:  You have more than a small spotting of blood in your stool.  You pass large blood clots in your stool.  Your abdomen is swollen  (distended).  You have nausea or vomiting.  You have a fever.  You have increasing abdominal pain that is not relieved with medicine.   This information is not intended to replace advice given to you by your health care provider. Make sure you discuss any questions you have with your health care provider.   Document Released: 08/18/2003 Document Revised: 10/24/2012 Document Reviewed: 09/10/2012 Elsevier Interactive Patient Education 2016 [employer].   Call if question or problem otherwise call for biopsy report in 1 week and follow-up as needed and repeat colonoscopy either 5-10 years pending pathology

## 2015-04-30 NOTE — Anesthesia Postprocedure Evaluation (Signed)
Anesthesia Post Note  Patient: Nicole Pacheco  Procedure(s) Performed: Procedure(s) (LRB): COLONOSCOPY WITH PROPOFOL (N/A)  Patient location during evaluation: PACU Anesthesia Type: MAC Level of consciousness: awake and alert Pain management: pain level controlled Vital Signs Assessment: post-procedure vital signs reviewed and stable Respiratory status: spontaneous breathing, nonlabored ventilation, respiratory function stable and patient connected to nasal cannula oxygen Cardiovascular status: stable and blood pressure returned to baseline Anesthetic complications: no    Last Vitals:  Filed Vitals:   04/30/15 0950 04/30/15 0955  BP:  117/64  Pulse: 66 71  Temp:    Resp: 18 28    Last Pain: There were no vitals filed for this visit.               Laithan Conchas J

## 2015-04-30 NOTE — Anesthesia Preprocedure Evaluation (Addendum)
Anesthesia Evaluation  Patient identified by MRN, date of birth, ID band Patient awake    Reviewed: Allergy & Precautions, NPO status , Patient's Chart, lab work & pertinent test results  Airway Mallampati: II  TM Distance: >3 FB Neck ROM: Full    Dental no notable dental hx.    Pulmonary Current Smoker,    Pulmonary exam normal breath sounds clear to auscultation       Cardiovascular negative cardio ROS Normal cardiovascular exam Rhythm:Regular Rate:Normal     Neuro/Psych negative neurological ROS  negative psych ROS   GI/Hepatic Neg liver ROS, GERD  Medicated,  Endo/Other  Morbid obesity  Renal/GU negative Renal ROS  negative genitourinary   Musculoskeletal negative musculoskeletal ROS (+)   Abdominal (+) + obese,   Peds negative pediatric ROS (+)  Hematology negative hematology ROS (+)   Anesthesia Other Findings   Reproductive/Obstetrics negative OB ROS                            Anesthesia Physical Anesthesia Plan  ASA: III  Anesthesia Plan: MAC   Post-op Pain Management:    Induction: Intravenous  Airway Management Planned: Natural Airway  Additional Equipment:   Intra-op Plan:   Post-operative Plan:   Informed Consent: I have reviewed the patients History and Physical, chart, labs and discussed the procedure including the risks, benefits and alternatives for the proposed anesthesia with the patient or authorized representative who has indicated his/her understanding and acceptance.   Dental advisory given  Plan Discussed with: CRNA  Anesthesia Plan Comments:         Anesthesia Quick Evaluation

## 2015-05-04 ENCOUNTER — Encounter (HOSPITAL_COMMUNITY): Payer: Self-pay | Admitting: Gastroenterology

## 2016-03-06 IMAGING — CR DG CERVICAL SPINE 2 OR 3 VIEWS
2 series · 2 of 2 positions shown · non-contrast
Comparison: Intraoperative radiographs dated 05/15/2014

CLINICAL DATA: Neck pain.

EXAM:
CERVICAL SPINE - 2-3 VIEW

[AP (1 of 2)]
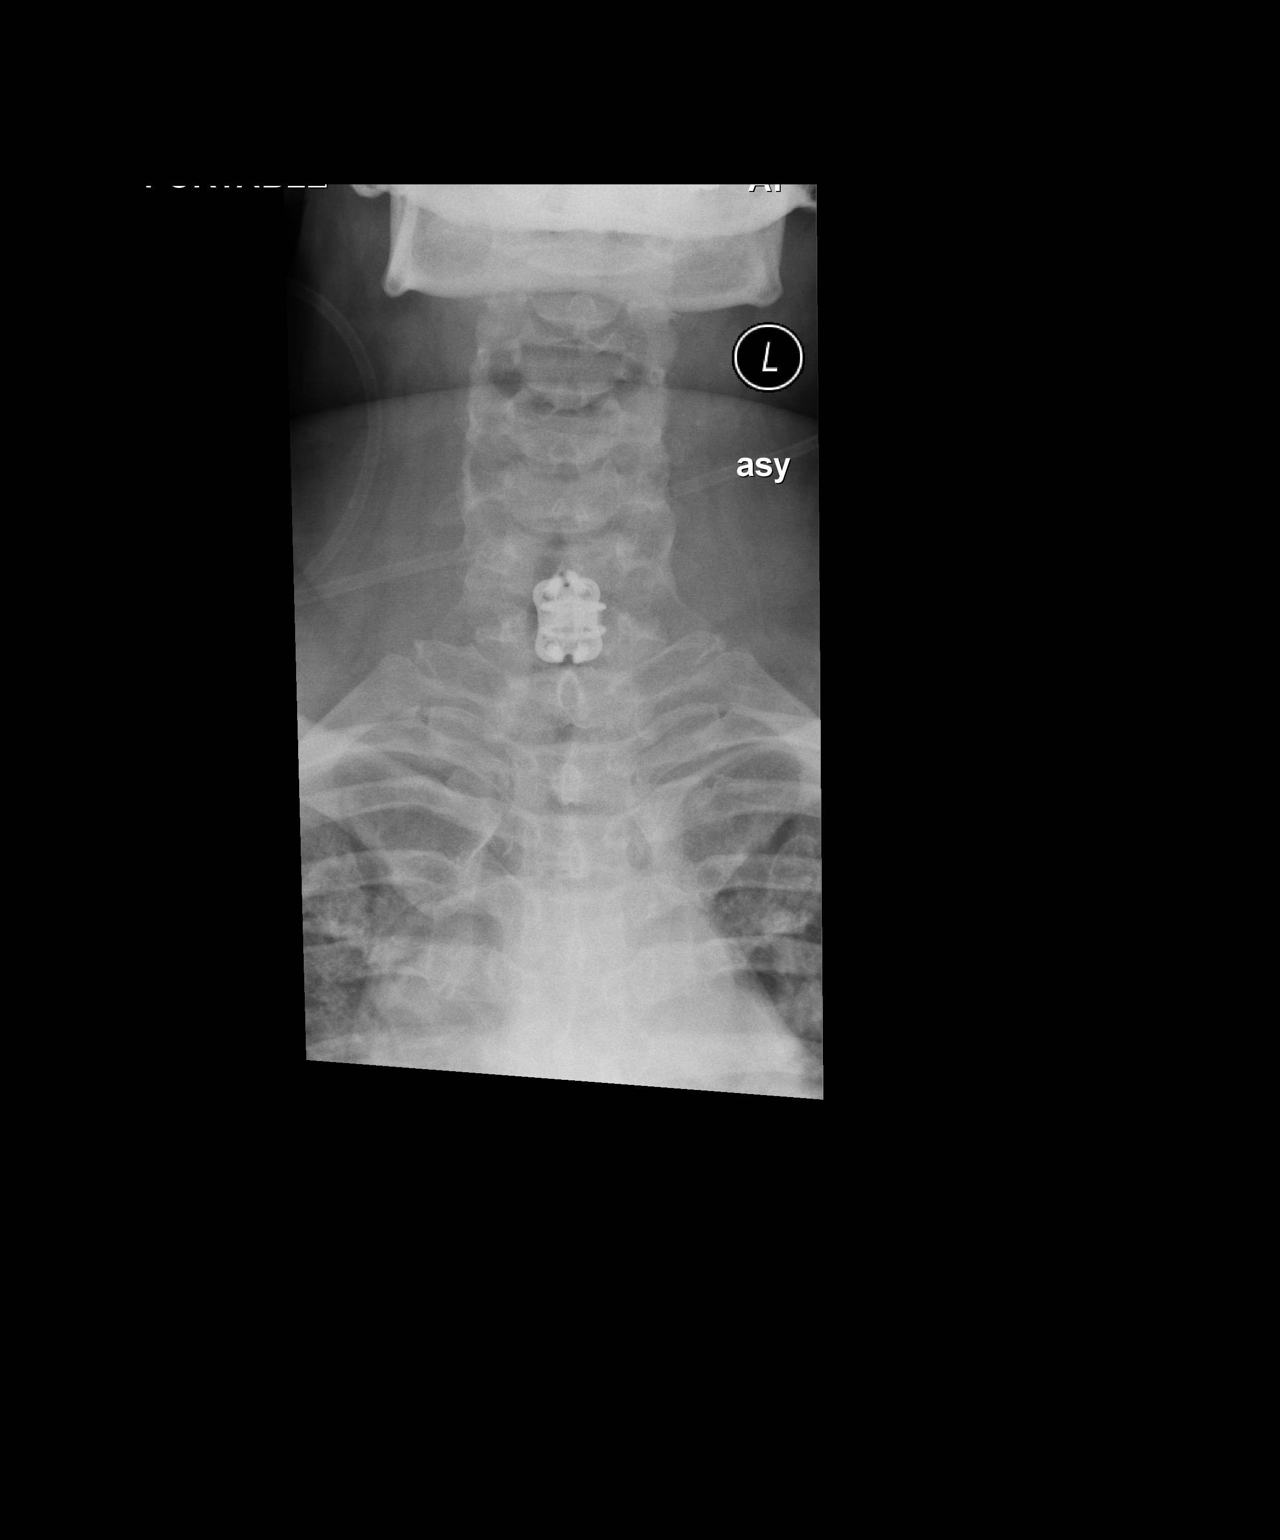

[AP (2 of 2)]
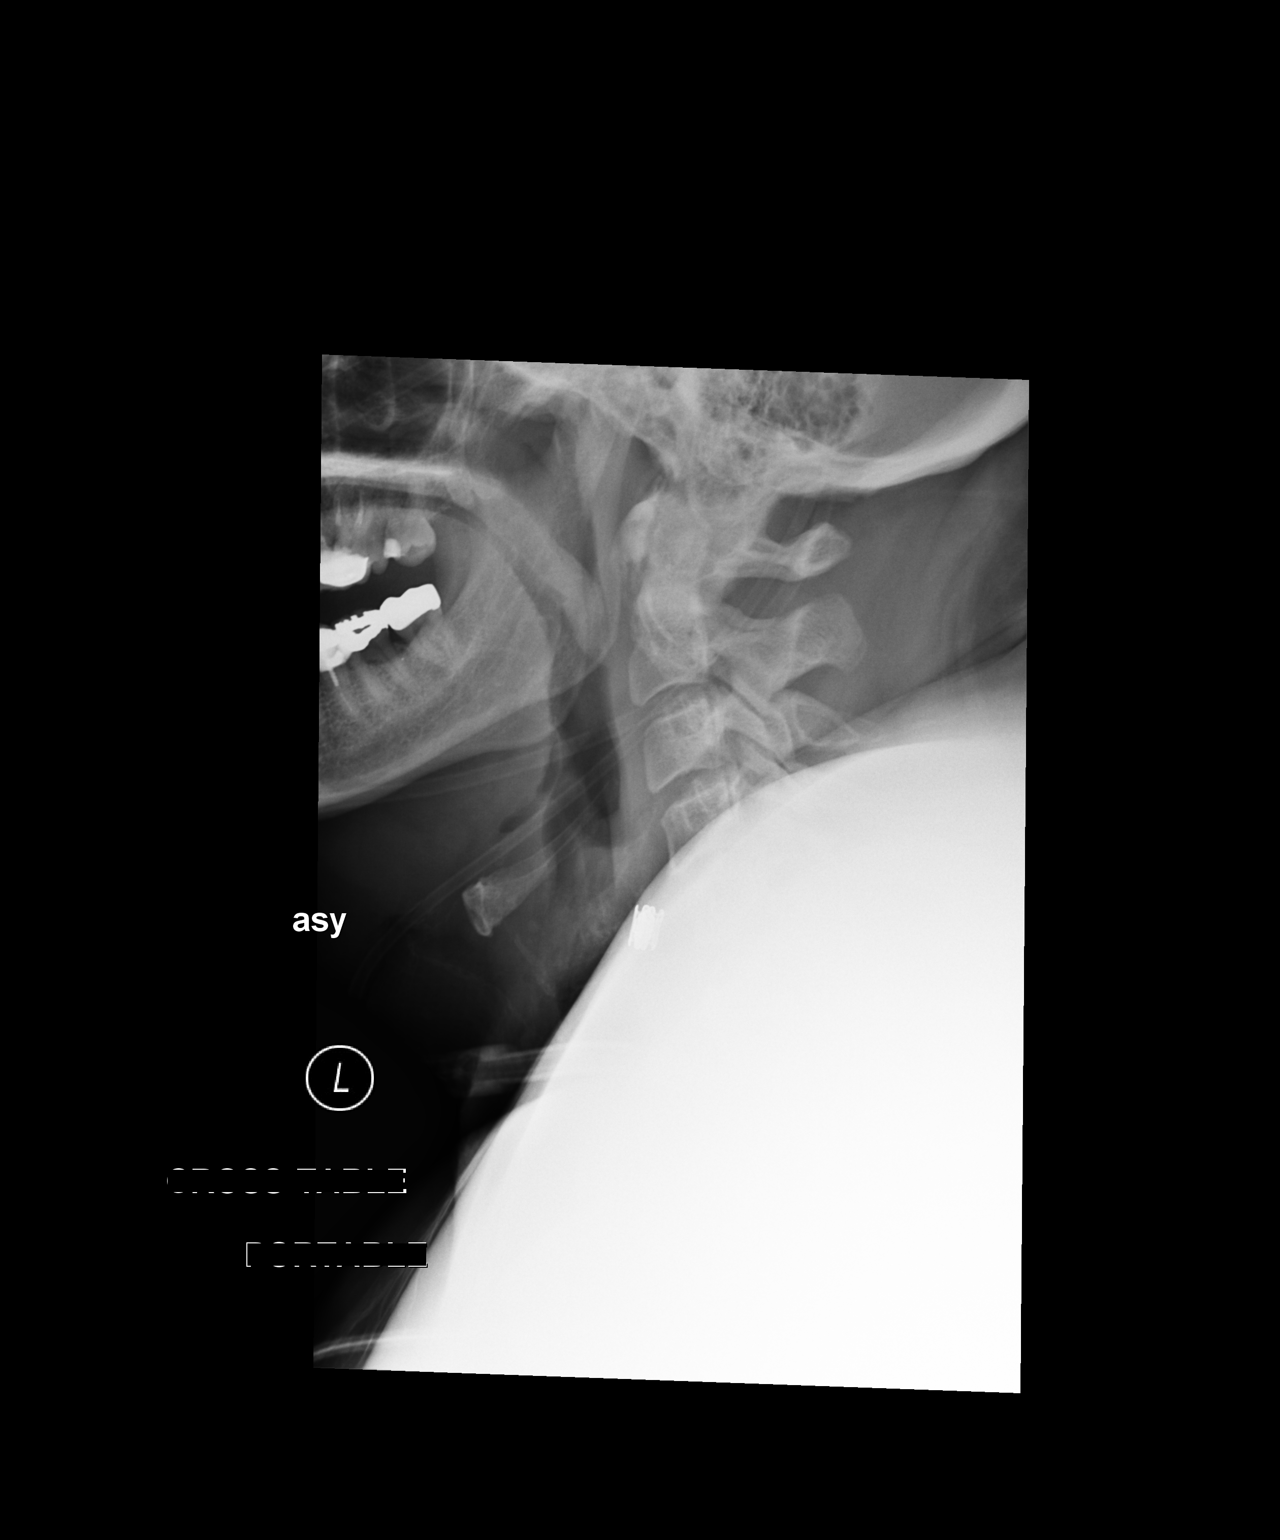

[2 of 2 positions shown; findings below may reference images not displayed]

FINDINGS: AP and lateral views demonstrate the patient has undergone anterior
cervical fusion at C6-7. The level of fusion is not visible on the
lateral view due to the patient's shoulders.
IMPRESSION: Anterior fusion at C6-7. The lateral view does not visualize the
surgical level.

## 2016-03-06 IMAGING — RF DG C-ARM 61-120 MIN
1 series · 4 of 4 positions shown · non-contrast
Comparison: None.

CLINICAL DATA: CERVICAL DISC DISEASE.

EXAM:
CERVICAL SPINE  2 VIEWS, C-ARM IMAGING 61-120 MIN

[Series 1: run · 4 of 4 slices shown]
[im 1/4]
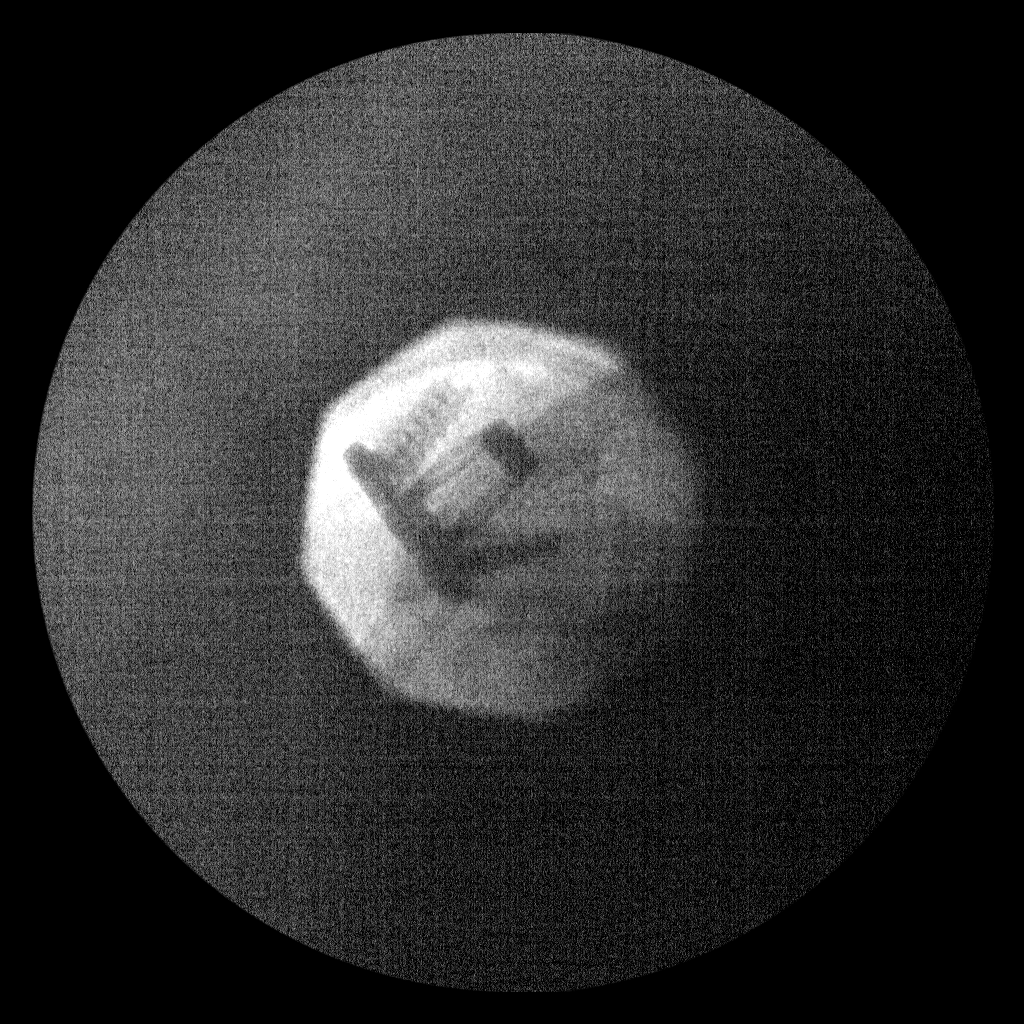
[im 2/4]
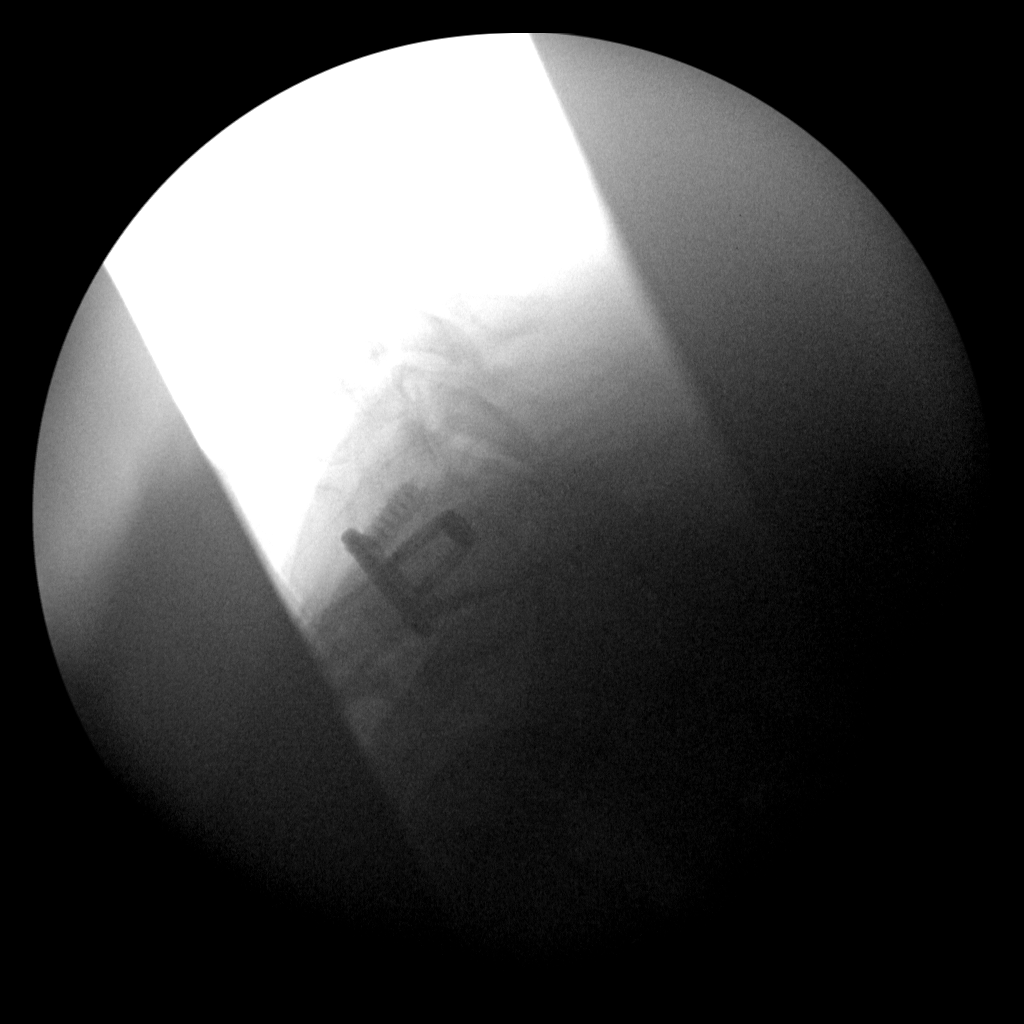
[im 3/4]
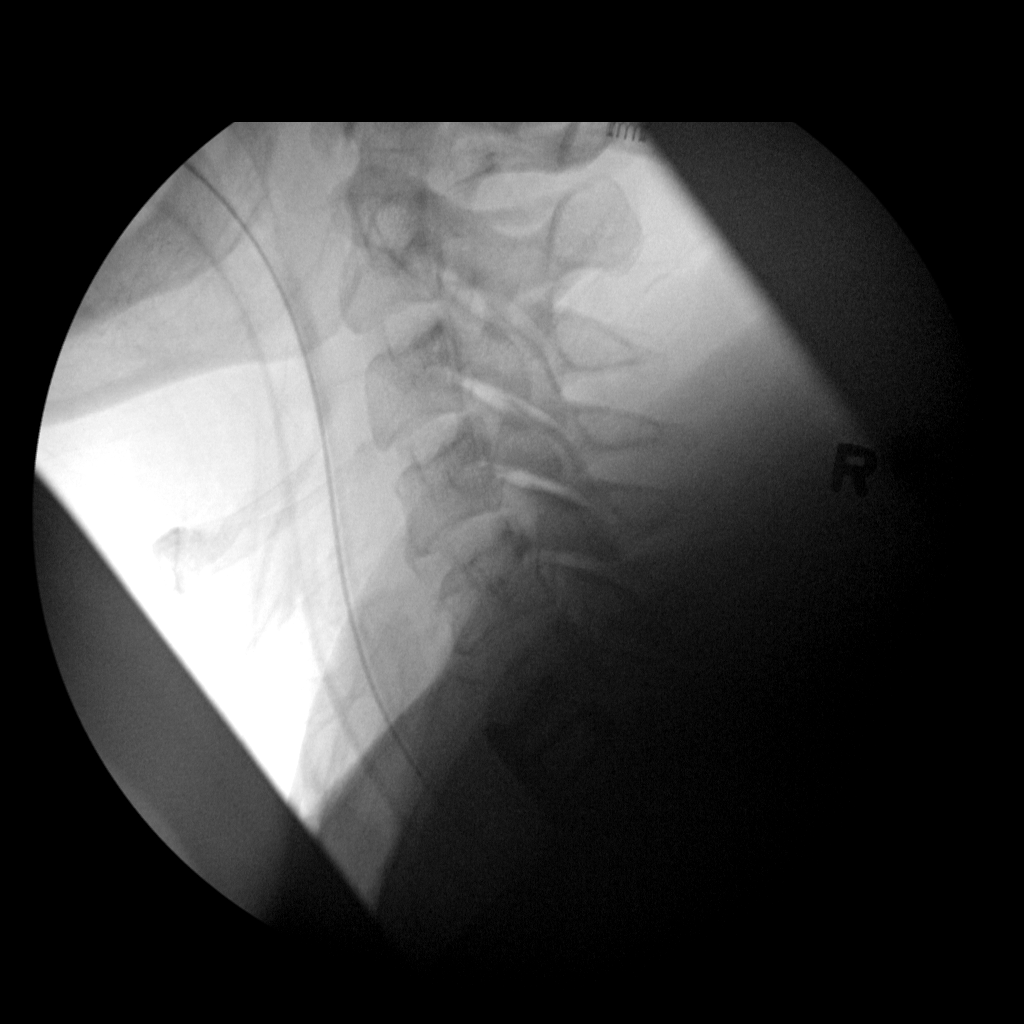
[im 4/4]
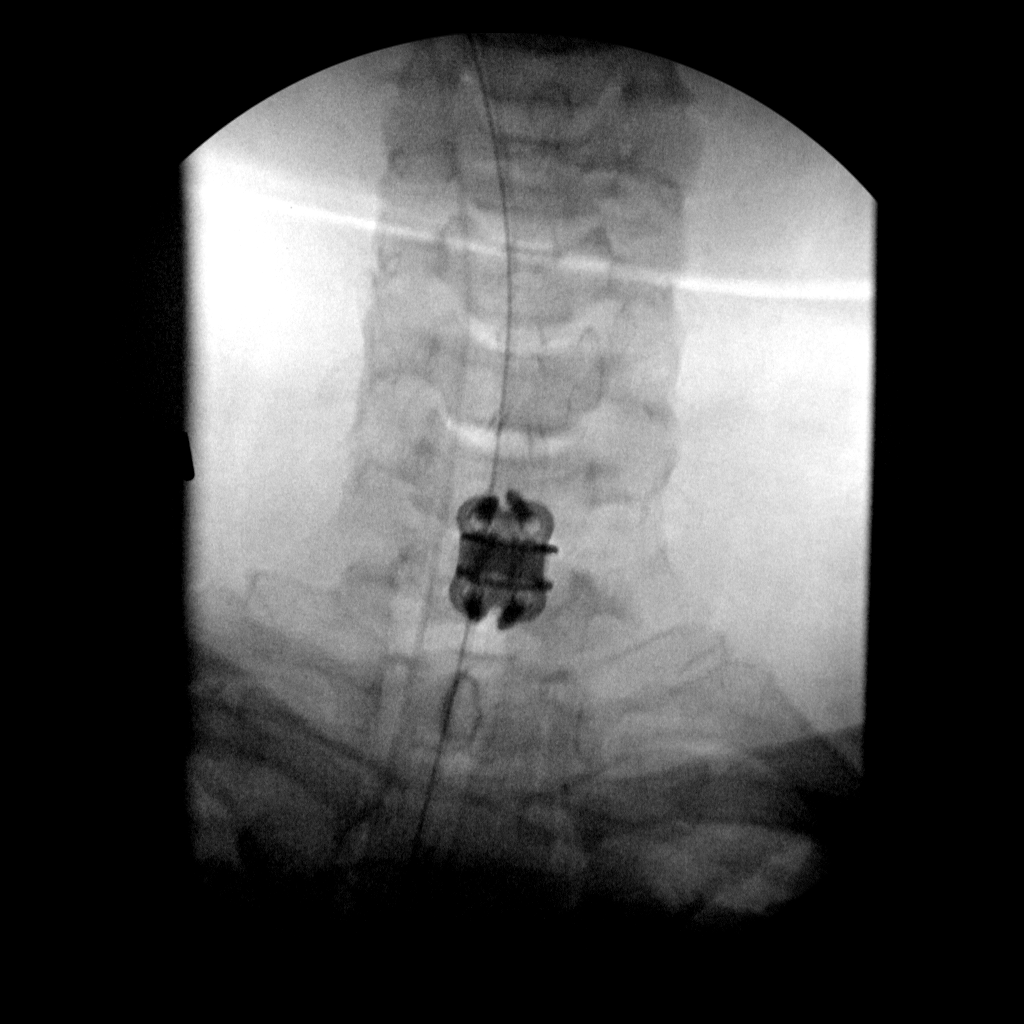

[4 of 4 positions shown; findings below may reference images not displayed]

FINDINGS: Multiple C-arm images in the AP and lateral projection demonstrate
the patient has undergone anterior cervical fusion at C6-7.
IMPRESSION: Anterior cervical fusion performed at C6-7.

## 2017-01-14 IMAGING — US US ABDOMEN COMPLETE
1 series · 13 of 25 positions shown · non-contrast
Comparison: None.

CLINICAL DATA: Elevated liver function tests.

EXAM:
ABDOMEN ULTRASOUND COMPLETE

[Series 1: us abdomen complete · 0.25mm/px · 13 of 70 slices shown]
[im 1/70]
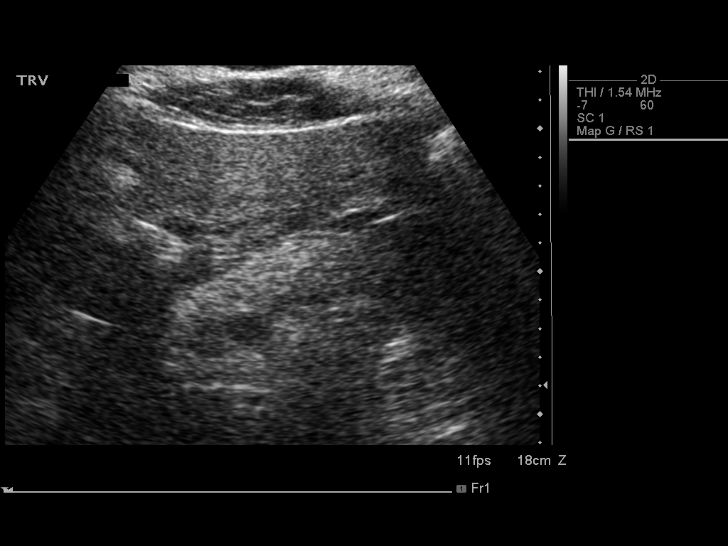
[im 6/70]
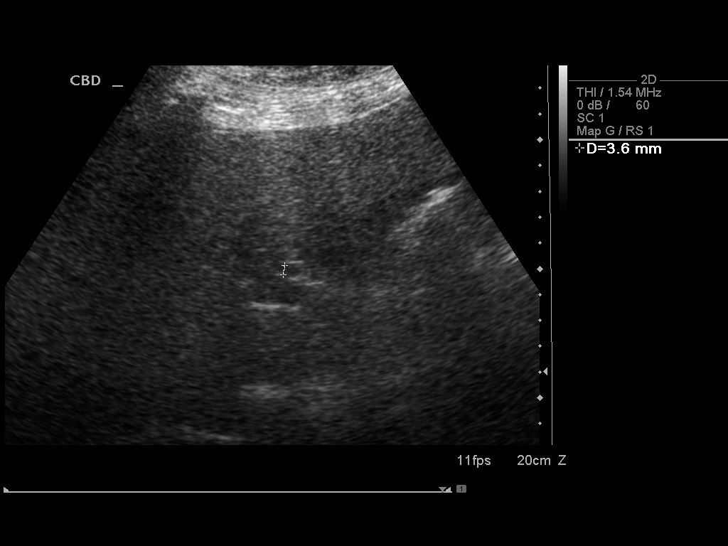
[im 12/70]
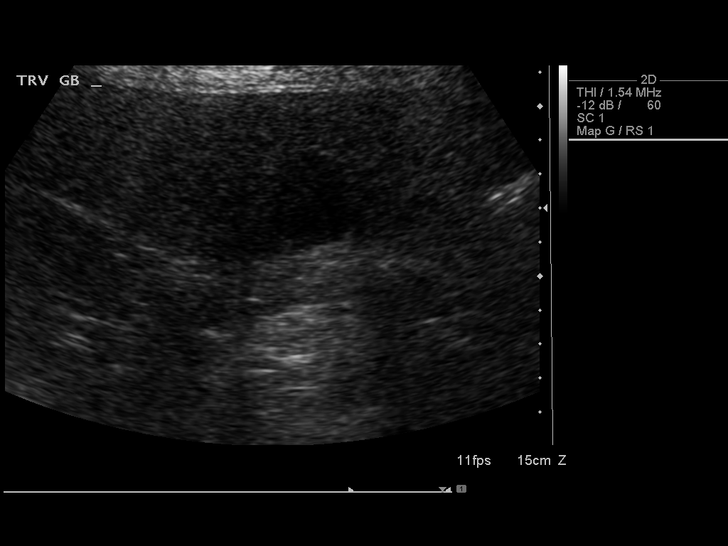
[im 18/70]
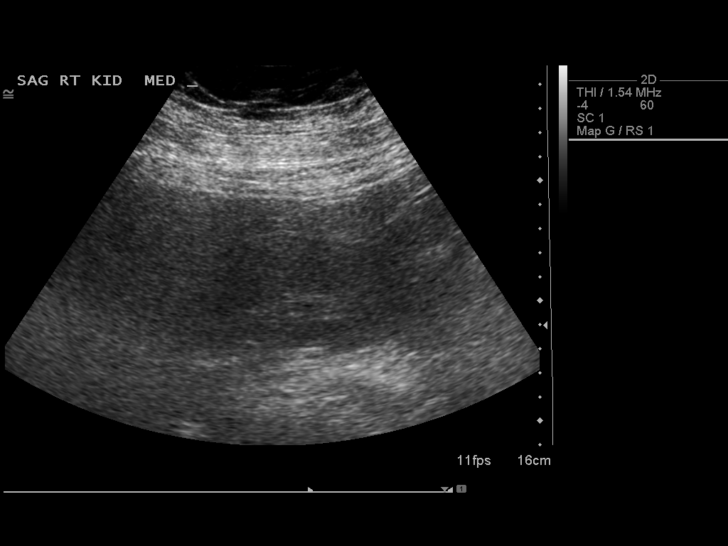
[im 24/70]
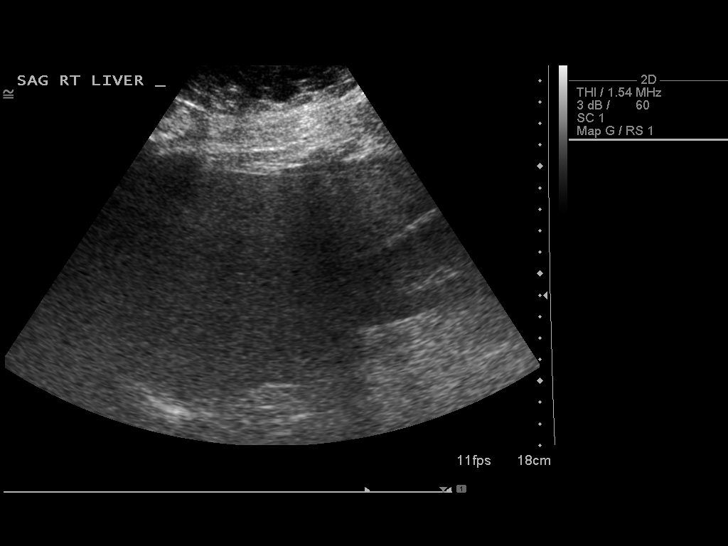
[im 29/70]
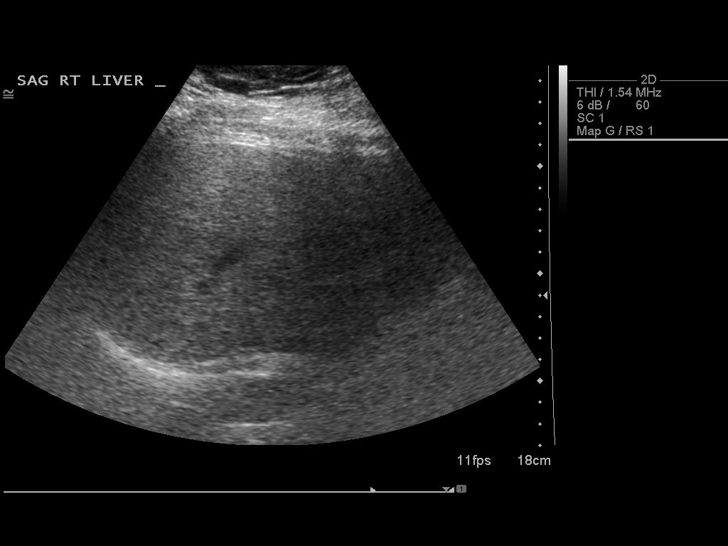
[im 35/70]
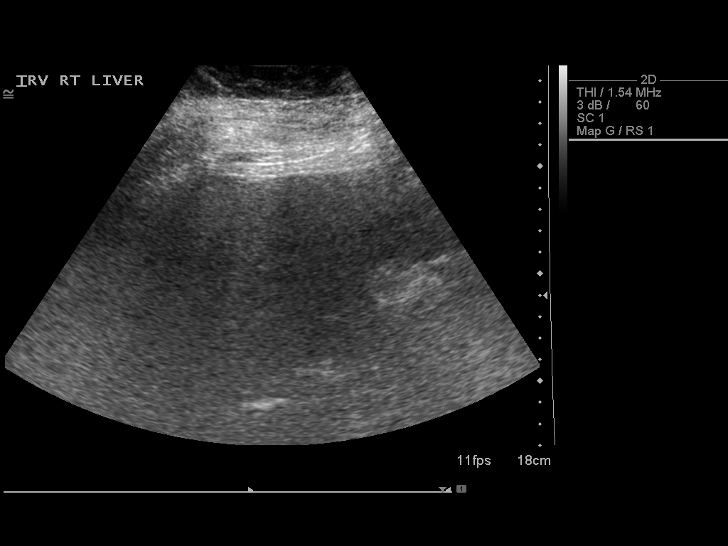
[im 41/70]
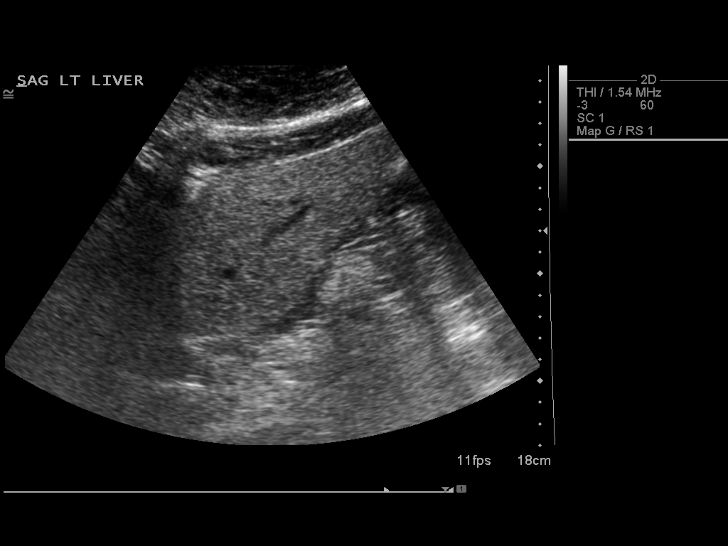
[im 47/70]
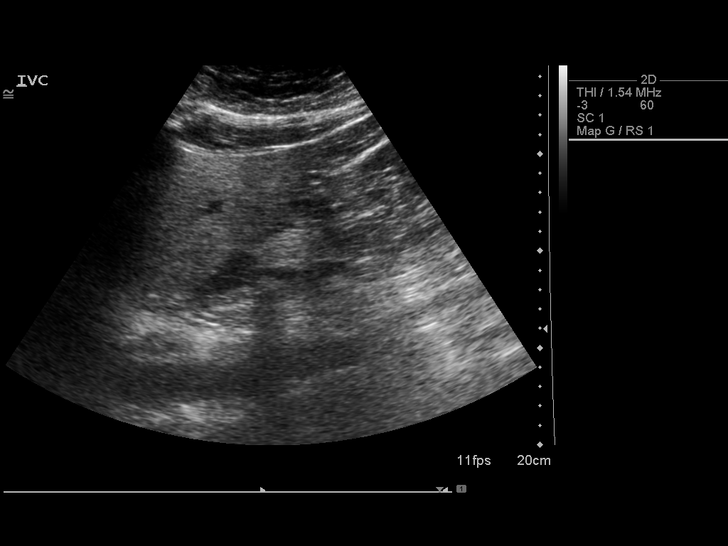
[im 52/70]
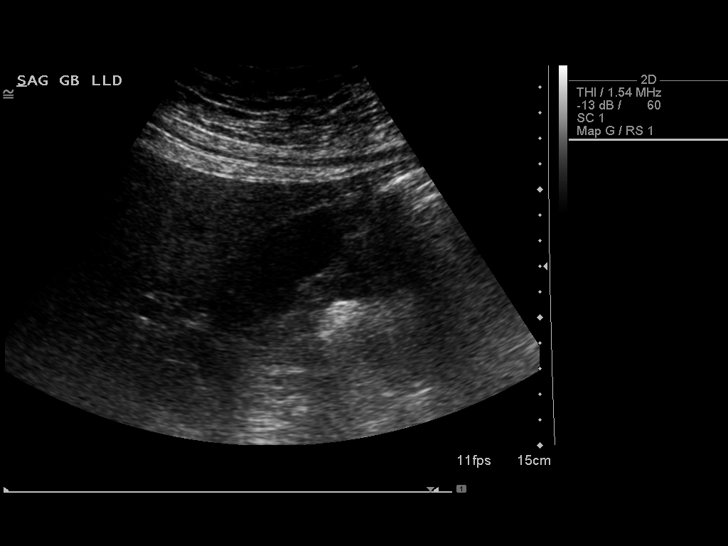
[im 58/70]
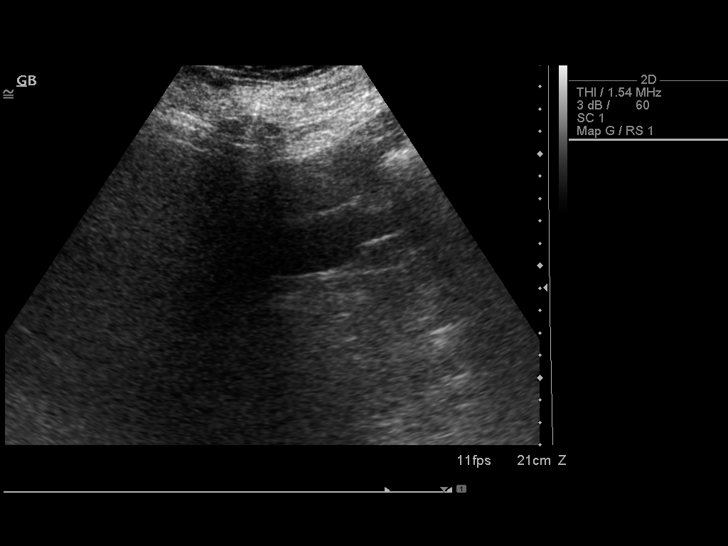
[im 64/70]
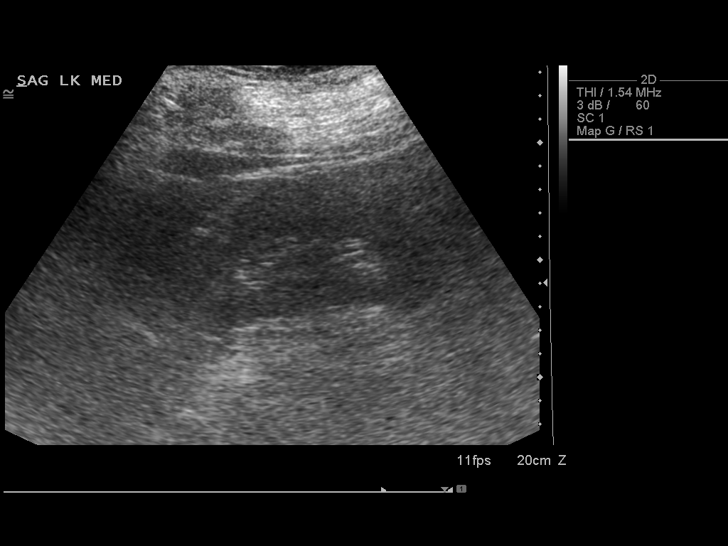
[im 70/70]
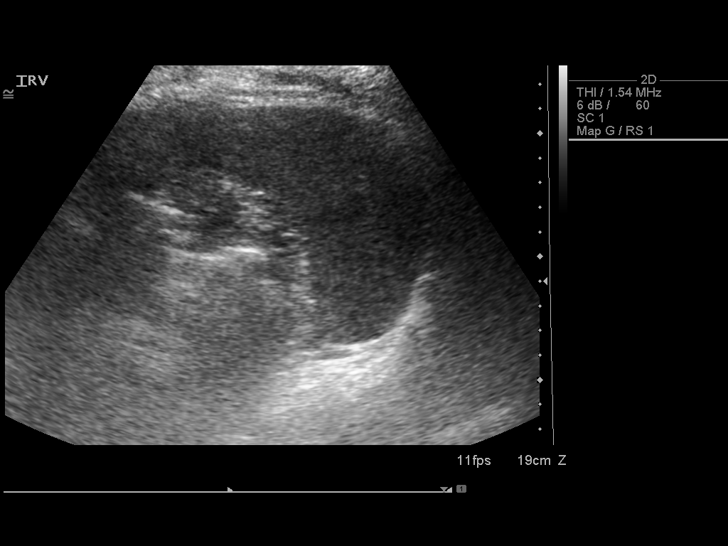

[13 of 25 positions shown; findings below may reference images not displayed]

FINDINGS: EXAM IS SIGNIFICANTLY LIMITED DUE TO BODY HABITUS.:
FINDINGS: EXAM IS SIGNIFICANTLY LIMITED DUE TO BODY HABITUS.
Gallbladder: No gallstones or wall thickening visualized. No
sonographic Murphy sign noted by sonographer.

Common bile duct: Diameter: 3.6 mm which is within normal limits.

Liver: No focal lesion identified. Due to body habitus, evaluation
of hepatic parenchyma is extremely limited. No definite focal
abnormality is noted.

IVC: No abnormality visualized.

Pancreas: Visualized portion unremarkable.

Spleen: Size and appearance within normal limits.

Right Kidney: Length: 10.5 cm. Echogenicity within normal limits. No
mass or hydronephrosis visualized.

Left Kidney: Length: 11.6 cm. Echogenicity within normal limits. No
mass or hydronephrosis visualized.

Abdominal aorta: Not visualized due to body habitus.

Other findings: None.
IMPRESSION: Exam is significantly limited due to body habitus. Abdominal aorta
is not visualized as a result. Hepatic parenchyma could not be well
visualized either. No definite abnormality is seen with the
visualized structures.

## 2019-03-21 ENCOUNTER — Other Ambulatory Visit: Payer: Self-pay | Admitting: Obstetrics and Gynecology

## 2019-03-21 DIAGNOSIS — Z9189 Other specified personal risk factors, not elsewhere classified: Secondary | ICD-10-CM

## 2020-02-19 ENCOUNTER — Encounter: Payer: Self-pay | Admitting: Dermatology

## 2020-02-19 ENCOUNTER — Ambulatory Visit: Payer: 59 | Admitting: Dermatology

## 2020-02-19 ENCOUNTER — Other Ambulatory Visit: Payer: Self-pay

## 2020-02-19 DIAGNOSIS — Z1283 Encounter for screening for malignant neoplasm of skin: Secondary | ICD-10-CM

## 2020-02-19 DIAGNOSIS — Z85828 Personal history of other malignant neoplasm of skin: Secondary | ICD-10-CM

## 2020-02-19 DIAGNOSIS — L821 Other seborrheic keratosis: Secondary | ICD-10-CM

## 2020-02-28 ENCOUNTER — Encounter: Payer: Self-pay | Admitting: Dermatology

## 2020-02-28 NOTE — Progress Notes (Signed)
   Follow-Up Visit   Subjective  Nicole Pacheco is a 55 y.o. female who presents for the following: Annual Exam (No new concerns).  General skin examination Location:  Duration:  Quality:  Associated Signs/Symptoms: Modifying Factors:  Severity:  Timing: Context:   Objective  Well appearing patient in no apparent distress; mood and affect are within normal limits. Objective  Right Malar Cheek: Light scar- clear  Objective  Head - Anterior (Face): Textured brown 3 to 6 mm papules.   Scalp Under breast and tags Post legs All stable  Objective  Left Breast: All sun exposed areas plus arms, legs, upper chest and back examined.  No atypical moles or nonmole skin cancer.    All sun exposed areas plus back examined.   Assessment & Plan    History of basal cell cancer Right Malar Cheek  Check as needed change  Seborrheic keratosis Head - Anterior (Face)  Leave if stable.  Encounter for screening for malignant neoplasm of skin Left Breast  Encouraged to self examine her skin twice annually.      I, Lavonna Monarch, MD, have reviewed all documentation for this visit.  The documentation on 02/28/20 for the exam, diagnosis, procedures, and orders are all accurate and complete.

## 2020-08-26 ENCOUNTER — Ambulatory Visit (INDEPENDENT_AMBULATORY_CARE_PROVIDER_SITE_OTHER): Payer: 59 | Admitting: Family Medicine

## 2020-08-26 ENCOUNTER — Other Ambulatory Visit: Payer: Self-pay

## 2020-08-26 ENCOUNTER — Encounter (INDEPENDENT_AMBULATORY_CARE_PROVIDER_SITE_OTHER): Payer: Self-pay | Admitting: Family Medicine

## 2020-08-26 VITALS — BP 120/77 | HR 91 | Temp 98.2°F | Ht 68.0 in | Wt 308.0 lb

## 2020-08-26 DIAGNOSIS — R0602 Shortness of breath: Secondary | ICD-10-CM

## 2020-08-26 DIAGNOSIS — R7303 Prediabetes: Secondary | ICD-10-CM | POA: Diagnosis not present

## 2020-08-26 DIAGNOSIS — R7401 Elevation of levels of liver transaminase levels: Secondary | ICD-10-CM

## 2020-08-26 DIAGNOSIS — Z9189 Other specified personal risk factors, not elsewhere classified: Secondary | ICD-10-CM | POA: Diagnosis not present

## 2020-08-26 DIAGNOSIS — I10 Essential (primary) hypertension: Secondary | ICD-10-CM

## 2020-08-26 DIAGNOSIS — Z1331 Encounter for screening for depression: Secondary | ICD-10-CM

## 2020-08-26 DIAGNOSIS — R5383 Other fatigue: Secondary | ICD-10-CM | POA: Diagnosis not present

## 2020-08-26 DIAGNOSIS — E782 Mixed hyperlipidemia: Secondary | ICD-10-CM

## 2020-08-26 DIAGNOSIS — Z0289 Encounter for other administrative examinations: Secondary | ICD-10-CM

## 2020-08-26 DIAGNOSIS — E559 Vitamin D deficiency, unspecified: Secondary | ICD-10-CM

## 2020-08-26 DIAGNOSIS — Z720 Tobacco use: Secondary | ICD-10-CM

## 2020-08-26 DIAGNOSIS — Z6841 Body Mass Index (BMI) 40.0 and over, adult: Secondary | ICD-10-CM

## 2020-08-26 NOTE — Progress Notes (Signed)
Dear Dr. Rolena Infante,   Thank you for referring Nicole Pacheco to our clinic. The following note includes my evaluation and treatment recommendations.  Chief Complaint:   OBESITY Nicole Pacheco (MR# [id number]) is a 55 y.o. female who presents for evaluation and treatment of obesity and related comorbidities. Current BMI is Body mass index is 46.83 kg/m. Nicole Pacheco has been struggling with her weight for many years and has been unsuccessful in either losing weight, maintaining weight loss, or reaching her healthy weight goal.  Nicole Pacheco is currently in the action stage of change and ready to dedicate time achieving and maintaining a healthier weight. Nicole Pacheco is interested in becoming our patient and working on intensive lifestyle modifications including (but not limited to) diet and exercise for weight loss.  Nicole Pacheco was referred by Dr. Rolena Infante. Breakfast- muffin or [employer] chicken biscuit with coffee with sugar + creamer (satisfied); Lunch ~1 PM- sandwich with 2-3 slices [place] + cheese + mayo + mustard + chips (?) sandwich bag (plain) (satisfied); Occasional snack when getting home - cheese or muffin (needs to eat); Dinner- 3-5 oz meat, 1 cup potato, 1 cup vegetable (fell full); when going out Chicken parmesan with spaghetti and salad; Snack- 3-4 chips Ahoy cookie and chips and ranch dip.  Nicole Pacheco habits were reviewed today and are as follows: Her family eats meals together, she thinks her family will eat healthier with her, her desired weight loss is 58 lbs, she started gaining weight after childbirth, her heaviest weight ever was 308 pounds, she snacks frequently in the evenings, she is frequently drinking liquids with calories, she frequently makes poor food choices, she frequently eats larger portions than normal, and she struggles with emotional eating.  Depression Screen Nicole Pacheco's Food and Mood (modified PHQ-9) score was 10.  Depression screen PHQ 2/9 08/26/2020  Decreased Interest 2  Down,  Depressed, Hopeless 1  PHQ - 2 Score 3  Altered sleeping 1  Tired, decreased energy 3  Change in appetite 1  Feeling bad or failure about yourself  1  Trouble concentrating 0  Moving slowly or fidgety/restless 1  Suicidal thoughts 0  PHQ-9 Score 10  Difficult doing work/chores Not difficult at all   Subjective:   1. Other fatigue Nicole Pacheco admits to daytime somnolence and denies waking up still tired. Patent has a history of symptoms of daytime fatigue. Nicole Pacheco generally gets 7 hours of sleep per night, and states that she has generally restful sleep. Snoring is present. Apneic episodes are not present. Epworth Sleepiness Score is 5. EKG normal sinus rhythm at 86 bpm- low voltage.  2. SOB (shortness of breath) Nicole Pacheco notes increasing shortness of breath with exercising and seems to be worsening over time with weight gain. She notes getting out of breath sooner with activity than she used to. This has gotten worse recently. Nicole Pacheco denies shortness of breath at rest or orthopnea. EKG normal sinus rhythm at 86 bpm- voltage.  3. Pre-diabetes New diagnosis as of 06/2020. Nicole Pacheco's A1c is 5.8. She is not on medication.  4. Transaminitis Her AST is 63 and ALT 76. She has no history of elevations.  5. Essential hypertension Diagnosed years ago. Nicole Pacheco is on lisinopril and HCTZ.  6. Vitamin D deficiency Pt is on multivitamin. She has no Vit D on file.  7. Mixed hyperlipidemia Nicole Pacheco LDL is >100 and HDL >50. She is not on statin therapy.  8. Tobacco abuse She is still smoking a 1/2 pack per day and is not sure if she  wants to quit.  9. At risk for diabetes mellitus Nicole Pacheco is at higher than average risk for developing diabetes due to obesity.   Assessment/Plan:   1. Other fatigue Nicole Pacheco does feel that her weight is causing her energy to be lower than it should be. Fatigue may be related to obesity, depression or many other causes. Labs will be ordered, and in the meanwhile, Sudiksha will focus on  self care including making healthy food choices, increasing physical activity and focusing on stress reduction. Check labs today.  - EKG 12-Lead - Vitamin B12 - Folate - T3 - T4, free  2. SOB (shortness of breath) Becka does feel that she gets out of breath more easily that she used to when she exercises. Nicole Pacheco's shortness of breath appears to be obesity related and exercise induced. She has agreed to work on weight loss and gradually increase exercise to treat her exercise induced shortness of breath. Will continue to monitor closely.  3. Pre-diabetes Nicole Pacheco will continue to work on weight loss, exercise, and decreasing simple carbohydrates to help decrease the risk of diabetes.  Check labs today.  - Insulin, random  4. Transaminitis Check CMP today.  5. Essential hypertension Nicole Pacheco is working on healthy weight loss and exercise to improve blood pressure control. We will watch for signs of hypotension as she continues her lifestyle modifications. Check labs today.  - Comprehensive metabolic panel  6. Vitamin D deficiency Low Vitamin D level contributes to fatigue and are associated with obesity, breast, and colon cancer. She agrees to continue multivitamin and will follow-up for routine testing of Vitamin D, at least 2-3 times per year to avoid over-replacement. Check labs today.  - VITAMIN D 25 Hydroxy (Vit-D Deficiency, Fractures)  7. Mixed hyperlipidemia Cardiovascular risk and specific lipid/LDL goals reviewed.  We discussed several lifestyle modifications today and Nicole Pacheco will continue to work on diet, exercise and weight loss efforts. Orders and follow up as documented in patient record. Repeat labs in 3 months.  Counseling Intensive lifestyle modifications are the first line treatment for this issue. Dietary changes: Increase soluble fiber. Decrease simple carbohydrates. Exercise changes: Moderate to vigorous-intensity aerobic activity 150 minutes per week if  tolerated. Lipid-lowering medications: see documented in medical record.  8. Tobacco abuse Follow up at subsequent appts.  9. Depression screening Nicole Pacheco had a positive depression screening. Depression is commonly associated with obesity and often results in emotional eating behaviors. We will monitor this closely and work on CBT to help improve the non-hunger eating patterns. Referral to Psychology may be required if no improvement is seen as she continues in our clinic.  10. At risk for diabetes mellitus Nicole Pacheco was given approximately 15 minutes of diabetes education and counseling today. We discussed intensive lifestyle modifications today with an emphasis on weight loss as well as increasing exercise and decreasing simple carbohydrates in her diet. We also reviewed medication options with an emphasis on risk versus benefit of those discussed.   Repetitive spaced learning was employed today to elicit superior memory formation and behavioral change.  11. Obesity with current BMI of 47.0  Nicole Pacheco is currently in the action stage of change and her goal is to continue with weight loss efforts. I recommend Nicole Pacheco begin the structured treatment plan as follows:  She has agreed to the Category 4 Plan.  Exercise goals: No exercise has been prescribed at this time.   Behavioral modification strategies: increasing lean protein intake, meal planning and cooking strategies, and dealing with family or  coworker sabotage.  She was informed of the importance of frequent follow-up visits to maximize her success with intensive lifestyle modifications for her multiple health conditions. She was informed we would discuss her lab results at her next visit unless there is a critical issue that needs to be addressed sooner. Nicole Pacheco agreed to keep her next visit at the agreed upon time to discuss these results.  Objective:   Blood pressure 120/77, pulse 91, temperature 98.2 F (36.8 C), height '5\' 8"'$  (1.727 m),  weight (!) 308 lb (139.7 kg), SpO2 99 %. Body mass index is 46.83 kg/m.  EKG: Normal sinus rhythm, rate 86- low voltage.  Indirect Calorimeter completed today shows a VO2 of 386 and a REE of 2664.  Her calculated basal metabolic rate is [id number] thus her basal metabolic rate is better than expected.  General: Cooperative, alert, well developed, in no acute distress. HEENT: Conjunctivae and lids unremarkable. Cardiovascular: Regular rhythm.  Lungs: Normal work of breathing. Neurologic: No focal deficits.   Lab Results  Component Value Date   CREATININE 0.68 05/13/2014   BUN 11 05/13/2014   NA 139 05/13/2014   K 4.0 05/13/2014   CL 106 05/13/2014   CO2 24 05/13/2014   No results found for: ALT, AST, GGT, ALKPHOS, BILITOT No results found for: HGBA1C No results found for: INSULIN No results found for: TSH No results found for: CHOL, HDL, LDLCALC, LDLDIRECT, TRIG, CHOLHDL Lab Results  Component Value Date   WBC 8.8 05/13/2014   HGB 13.2 05/13/2014   HCT 39.2 05/13/2014   MCV 85.4 05/13/2014   PLT 247 05/13/2014    Attestation Statements:   Reviewed by clinician on day of visit: allergies, medications, problem list, medical history, surgical history, family history, social history, and previous encounter notes.  Coral Ceo, CMA, am acting as transcriptionist for Coralie Common, MD.  This is the patient's first visit at [facility]. The patient's NEW PATIENT PACKET was reviewed at length. Included in the packet: current and past health history, medications, allergies, ROS, gynecologic history (women only), surgical history, family history, social history, weight history, weight loss surgery history (for those that have had weight loss surgery), nutritional evaluation, mood and food questionnaire, PHQ9, Epworth questionnaire, sleep habits questionnaire, patient life and health improvement goals questionnaire. These will all be scanned into the patient's chart  under media.   During the visit, I independently reviewed the patient's EKG, bioimpedance scale results, and indirect calorimeter results. I used this information to tailor a meal plan for the patient that will help her to lose weight and will improve her obesity-related conditions going forward. I performed a medically necessary appropriate examination and/or evaluation. I discussed the assessment and treatment plan with the patient. The patient was provided an opportunity to ask questions and all were answered. The patient agreed with the plan and demonstrated an understanding of the instructions. Labs were ordered at this visit and will be reviewed at the next visit unless more critical results need to be addressed immediately. Clinical information was updated and documented in the EMR.   Time spent on visit including pre-visit chart review and post-visit care was 45 minutes.   A separate 15 minutes was spent on risk counseling (see above).   I have reviewed the above documentation for accuracy and completeness, and I agree with the above. - Coralie Common, MD

## 2020-08-27 LAB — COMPREHENSIVE METABOLIC PANEL
ALT: 104 IU/L — ABNORMAL HIGH (ref 0–32)
AST: 92 IU/L — ABNORMAL HIGH (ref 0–40)
Albumin/Globulin Ratio: 1.4 (ref 1.2–2.2)
Albumin: 4.1 g/dL (ref 3.8–4.9)
Alkaline Phosphatase: 86 IU/L (ref 44–121)
BUN/Creatinine Ratio: 15 (ref 9–23)
BUN: 12 mg/dL (ref 6–24)
Bilirubin Total: 0.6 mg/dL (ref 0.0–1.2)
CO2: 24 mmol/L (ref 20–29)
Calcium: 9 mg/dL (ref 8.7–10.2)
Chloride: 100 mmol/L (ref 96–106)
Creatinine, Ser: 0.79 mg/dL (ref 0.57–1.00)
Globulin, Total: 2.9 g/dL (ref 1.5–4.5)
Glucose: 131 mg/dL — ABNORMAL HIGH (ref 65–99)
Potassium: 3.9 mmol/L (ref 3.5–5.2)
Sodium: 139 mmol/L (ref 134–144)
Total Protein: 7 g/dL (ref 6.0–8.5)
eGFR: 88 mL/min/{1.73_m2} (ref >59)

## 2020-08-27 LAB — FOLATE: Folate: 14 ng/mL (ref >3.0)

## 2020-08-27 LAB — T3: T3, Total: 133 ng/dL (ref 71–180)

## 2020-08-27 LAB — INSULIN, RANDOM: INSULIN: 42.6 u[IU]/mL — ABNORMAL HIGH (ref 2.6–24.9)

## 2020-08-27 LAB — VITAMIN B12: Vitamin B-12: 705 pg/mL (ref 232–1245)

## 2020-08-27 LAB — VITAMIN D 25 HYDROXY (VIT D DEFICIENCY, FRACTURES): Vit D, 25-Hydroxy: 30.4 ng/mL (ref 30.0–100.0)

## 2020-09-07 ENCOUNTER — Encounter (INDEPENDENT_AMBULATORY_CARE_PROVIDER_SITE_OTHER): Payer: Self-pay | Admitting: Family Medicine

## 2020-09-07 ENCOUNTER — Ambulatory Visit (INDEPENDENT_AMBULATORY_CARE_PROVIDER_SITE_OTHER): Payer: 59 | Admitting: Family Medicine

## 2020-09-07 ENCOUNTER — Other Ambulatory Visit: Payer: Self-pay

## 2020-09-07 VITALS — BP 123/75 | HR 90 | Temp 97.5°F | Ht 68.0 in | Wt 298.0 lb

## 2020-09-07 DIAGNOSIS — Z9189 Other specified personal risk factors, not elsewhere classified: Secondary | ICD-10-CM | POA: Diagnosis not present

## 2020-09-07 DIAGNOSIS — R7303 Prediabetes: Secondary | ICD-10-CM

## 2020-09-07 DIAGNOSIS — E559 Vitamin D deficiency, unspecified: Secondary | ICD-10-CM

## 2020-09-07 DIAGNOSIS — R7401 Elevation of levels of liver transaminase levels: Secondary | ICD-10-CM | POA: Diagnosis not present

## 2020-09-07 DIAGNOSIS — Z6841 Body Mass Index (BMI) 40.0 and over, adult: Secondary | ICD-10-CM

## 2020-09-07 DIAGNOSIS — I10 Essential (primary) hypertension: Secondary | ICD-10-CM

## 2020-09-07 MED ORDER — VITAMIN D (ERGOCALCIFEROL) 1.25 MG (50000 UNIT) PO CAPS: 50000.0000 [IU] | ORAL_CAPSULE | ORAL | 0 refills | Status: DC

## 2020-09-07 NOTE — Progress Notes (Signed)
Chief Complaint:   OBESITY [name] is here to discuss her progress with her obesity treatment plan along with follow-up of her obesity related diagnoses. Nicole Pacheco is on the Category 4 Plan and states she is following her eating plan approximately 60% of the time. Nicole Pacheco states she is not currently exercising.  Today's visit was #: 2 Starting weight: 308 lbs Starting date: 08/26/2020 Today's weight: 298 lbs Today's date: 09/07/2020 Total lbs lost to date: 10 Total lbs lost since last in-office visit: 10  Interim History: Nicole Pacheco had COVID over the last few weeks. She had no appetite and had significant taste distortion. She like yogurt and fruit but is having some difficulty eating if the thought of food makes her stomach uneasy. She is wondering about potatoes, rice, pasta- possible to incorporate then in meal plan. She is occasionally eating out. Snacks are not always consistent but she has fruit and cheese.  Subjective:   1. Pre-diabetes Discussed labs with patient today. Nicole Pacheco's 06/2020 A1c was 5.8 and 08/26/2020 insulin level 42.6. She is not on medication.  2. Transaminitis Worsening. Discussed labs with patient today. Prior labs showing AST 63 and ALT 76 but labs at her first appointment showed AST 92 and ALT 104. Her husband had Hep C.  3. Essential hypertension BP well controlled today. She is on lisinopril 10 mg daily and HCTZ 25 mg. Pt denies chest pain/chest pressure/headache.  4. Vitamin D deficiency Discussed labs with patient today. Nicole Pacheco's Vit D level is 30.4, and she reports fatigue.  5. At risk of diabetes mellitus Nicole Pacheco is at higher than average risk for developing diabetes due to obesity.   Assessment/Plan:   1. Pre-diabetes Nicole Pacheco will continue to work on weight loss, exercise, and decreasing simple carbohydrates to help decrease the risk of diabetes. Follow up labs in 3 months.  2. Transaminitis Follow up CMP in 3 months.  3. Essential hypertension Nicole Pacheco is  working on healthy weight loss and exercise to improve blood pressure control. We will watch for signs of hypotension as she continues her lifestyle modifications. Follow up BP at next appt.  4. Vitamin D deficiency Low Vitamin D level contributes to fatigue and are associated with obesity, breast, and colon cancer. She agrees to start to take prescription Vitamin D 50,000 IU every week and will follow-up for routine testing of Vitamin D, at least 2-3 times per year to avoid over-replacement.  Start- Vitamin D, Ergocalciferol, (DRISDOL) 1.25 MG (50000 UNIT) CAPS capsule; Take 1 capsule (50,000 Units total) by mouth every 7 (seven) days.  Dispense: 4 capsule; Refill: 0  5. At risk of diabetes mellitus Nicole Pacheco was given approximately 30 minutes of diabetes education and counseling today. We discussed intensive lifestyle modifications today with an emphasis on weight loss as well as increasing exercise and decreasing simple carbohydrates in her diet. We also reviewed medication options with an emphasis on risk versus benefit of those discussed.   Repetitive spaced learning was employed today to elicit superior memory formation and behavioral change.  6. Obesity with current BMI of 45.4  Nicole Pacheco is currently in the action stage of change. As such, her goal is to continue with weight loss efforts. She has agreed to the Category 4 Plan.   Exercise goals: No exercise has been prescribed at this time.  Behavioral modification strategies: increasing lean protein intake, meal planning and cooking strategies, and keeping healthy foods in the home.  Nicole Pacheco has agreed to follow-up with our clinic in 2 weeks.  She was informed of the importance of frequent follow-up visits to maximize her success with intensive lifestyle modifications for her multiple health conditions.   Objective:   Blood pressure 123/75, pulse 90, temperature (!) 97.5 F (36.4 C), height '5\' 8"'$  (1.727 m), weight 298 lb (135.2 kg), SpO2 99  %. Body mass index is 45.31 kg/m.  General: Cooperative, alert, well developed, in no acute distress. HEENT: Conjunctivae and lids unremarkable. Cardiovascular: Regular rhythm.  Lungs: Normal work of breathing. Neurologic: No focal deficits.   Lab Results  Component Value Date   CREATININE 0.79 08/26/2020   BUN 12 08/26/2020   NA 139 08/26/2020   K 3.9 08/26/2020   CL 100 08/26/2020   CO2 24 08/26/2020   Lab Results  Component Value Date   ALT 104 (H) 08/26/2020   AST 92 (H) 08/26/2020   ALKPHOS 86 08/26/2020   BILITOT 0.6 08/26/2020   No results found for: HGBA1C Lab Results  Component Value Date   INSULIN 42.6 (H) 08/26/2020   No results found for: TSH No results found for: CHOL, HDL, LDLCALC, LDLDIRECT, TRIG, CHOLHDL Lab Results  Component Value Date   VD25OH 30.4 08/26/2020   Lab Results  Component Value Date   WBC 8.8 05/13/2014   HGB 13.2 05/13/2014   HCT 39.2 05/13/2014   MCV 85.4 05/13/2014   PLT 247 05/13/2014    Attestation Statements:   Reviewed by clinician on day of visit: allergies, medications, problem list, medical history, surgical history, family history, social history, and previous encounter notes.  Coral Ceo, CMA, am acting as transcriptionist for Coralie Common, MD.   I have reviewed the above documentation for accuracy and completeness, and I agree with the above. - Coralie Common, MD

## 2020-09-16 ENCOUNTER — Ambulatory Visit (INDEPENDENT_AMBULATORY_CARE_PROVIDER_SITE_OTHER): Payer: Self-pay | Admitting: Family Medicine

## 2020-09-24 ENCOUNTER — Encounter (INDEPENDENT_AMBULATORY_CARE_PROVIDER_SITE_OTHER): Payer: Self-pay | Admitting: Family Medicine

## 2020-09-25 NOTE — Telephone Encounter (Signed)
Please advise 

## 2020-09-30 ENCOUNTER — Encounter (INDEPENDENT_AMBULATORY_CARE_PROVIDER_SITE_OTHER): Payer: Self-pay | Admitting: Family Medicine

## 2020-09-30 ENCOUNTER — Other Ambulatory Visit: Payer: Self-pay

## 2020-09-30 ENCOUNTER — Ambulatory Visit (INDEPENDENT_AMBULATORY_CARE_PROVIDER_SITE_OTHER): Payer: 59 | Admitting: Family Medicine

## 2020-09-30 VITALS — BP 121/72 | HR 87 | Temp 97.9°F | Ht 68.0 in | Wt 293.0 lb

## 2020-09-30 DIAGNOSIS — I10 Essential (primary) hypertension: Secondary | ICD-10-CM

## 2020-09-30 DIAGNOSIS — Z6841 Body Mass Index (BMI) 40.0 and over, adult: Secondary | ICD-10-CM | POA: Diagnosis not present

## 2020-09-30 DIAGNOSIS — Z9189 Other specified personal risk factors, not elsewhere classified: Secondary | ICD-10-CM | POA: Diagnosis not present

## 2020-09-30 DIAGNOSIS — E559 Vitamin D deficiency, unspecified: Secondary | ICD-10-CM

## 2020-09-30 MED ORDER — VITAMIN D (ERGOCALCIFEROL) 1.25 MG (50000 UNIT) PO CAPS: 50000.0000 [IU] | ORAL_CAPSULE | ORAL | 0 refills | Status: DC

## 2020-09-30 NOTE — Progress Notes (Signed)
Chief Complaint:   OBESITY Nicole Pacheco is here to discuss her progress with her obesity treatment plan along with follow-up of her obesity related diagnoses. Nicole Pacheco is on the Category 4 Plan and states she is following her eating plan approximately 70% of the time. Nicole Pacheco states she is doing PT 45 minutes 2 times per week.  Today's visit was #: 3 Starting weight: 308 lbs Starting date: 08/26/2020 Today's weight: 293 lbs Today's date: 09/30/2020 Total lbs lost to date: 15 Total lbs lost since last in-office visit: 5  Interim History: Nicole Pacheco is doing really well eating on plan Monday through Friday. She is eating out frequently on the weekend. On the weekends, it's sometimes breakfast Saturday morning and lunch Sunday morning. Pt had a Landscape architect Day weekend. She has no upcoming plans for the next few weeks.  Subjective:   1. Vitamin D deficiency Pt denies nausea, vomiting, and muscle weakness but notes fatigue. Her last Vit D level was 30.4..  2. Essential hypertension BP controlled today. Pt denies chest pain/chest pressure/headache.  3. At risk for osteoporosis Nicole Pacheco is at higher risk of osteopenia and osteoporosis due to Vitamin D deficiency.   Assessment/Plan:   1. Vitamin D deficiency Low Vitamin D level contributes to fatigue and are associated with obesity, breast, and colon cancer. She agrees to continue to take prescription Vitamin D 50,000 IU every week and will follow-up for routine testing of Vitamin D, at least 2-3 times per year to avoid over-replacement.  Refill- Vitamin D, Ergocalciferol, (DRISDOL) 1.25 MG (50000 UNIT) CAPS capsule; Take 1 capsule (50,000 Units total) by mouth every 7 (seven) days.  Dispense: 4 capsule; Refill: 0  2. Essential hypertension Nicole Pacheco is working on healthy weight loss and exercise to improve blood pressure control. We will watch for signs of hypotension as she continues her lifestyle modifications. Continue current treatment plan.  3. At  risk for osteoporosis Nicole Pacheco was given approximately 15 minutes of osteoporosis prevention counseling today. Nicole Pacheco is at risk for osteopenia and osteoporosis due to her Vitamin D deficiency. She was encouraged to take her Vitamin D and follow her higher calcium diet and increase strengthening exercise to help strengthen her bones and decrease her risk of osteopenia and osteoporosis.  Repetitive spaced learning was employed today to elicit superior memory formation and behavioral change.  4. Obesity with current BMI of 44.6  Nicole Pacheco is currently in the action stage of change. As such, her goal is to continue with weight loss efforts. She has agreed to the Category 4 Plan and keeping a food journal and adhering to recommended goals of 350-500 calories and 35+ grams protein with breakfast and 450-600 calories and 40+ grams protein with lunch.   Exercise goals:  As is  Behavioral modification strategies: increasing lean protein intake, decreasing simple carbohydrates, and meal planning and cooking strategies.  Nicole Pacheco has agreed to follow-up with our clinic in 2 weeks. She was informed of the importance of frequent follow-up visits to maximize her success with intensive lifestyle modifications for her multiple health conditions.   Objective:   Blood pressure 121/72, pulse 87, temperature 97.9 F (36.6 C), height '5\' 8"'$  (1.727 m), weight 293 lb (132.9 kg), SpO2 98 %. Body mass index is 44.55 kg/m.  General: Cooperative, alert, well developed, in no acute distress. HEENT: Conjunctivae and lids unremarkable. Cardiovascular: Regular rhythm.  Lungs: Normal work of breathing. Neurologic: No focal deficits.   Lab Results  Component Value Date   CREATININE 0.79 08/26/2020  BUN 12 08/26/2020   NA 139 08/26/2020   K 3.9 08/26/2020   CL 100 08/26/2020   CO2 24 08/26/2020   Lab Results  Component Value Date   ALT 104 (H) 08/26/2020   AST 92 (H) 08/26/2020   ALKPHOS 86 08/26/2020   BILITOT 0.6  08/26/2020   No results found for: HGBA1C Lab Results  Component Value Date   INSULIN 42.6 (H) 08/26/2020   No results found for: TSH No results found for: CHOL, HDL, LDLCALC, LDLDIRECT, TRIG, CHOLHDL Lab Results  Component Value Date   VD25OH 30.4 08/26/2020   Lab Results  Component Value Date   WBC 8.8 05/13/2014   HGB 13.2 05/13/2014   HCT 39.2 05/13/2014   MCV 85.4 05/13/2014   PLT 247 05/13/2014   Attestation Statements:   Reviewed by clinician on day of visit: allergies, medications, problem list, medical history, surgical history, family history, social history, and previous encounter notes.  Coral Ceo, CMA, am acting as transcriptionist for Coralie Common, MD.   I have reviewed the above documentation for accuracy and completeness, and I agree with the above. - Coralie Common, MD

## 2020-10-14 ENCOUNTER — Ambulatory Visit (INDEPENDENT_AMBULATORY_CARE_PROVIDER_SITE_OTHER): Payer: 59 | Admitting: Family Medicine

## 2020-10-20 ENCOUNTER — Other Ambulatory Visit: Payer: Self-pay

## 2020-10-20 ENCOUNTER — Ambulatory Visit (INDEPENDENT_AMBULATORY_CARE_PROVIDER_SITE_OTHER): Payer: 59 | Admitting: Family Medicine

## 2020-10-20 ENCOUNTER — Encounter (INDEPENDENT_AMBULATORY_CARE_PROVIDER_SITE_OTHER): Payer: Self-pay | Admitting: Family Medicine

## 2020-10-20 VITALS — BP 126/79 | HR 88 | Temp 97.7°F | Ht 68.0 in | Wt 291.0 lb

## 2020-10-20 DIAGNOSIS — E559 Vitamin D deficiency, unspecified: Secondary | ICD-10-CM

## 2020-10-20 DIAGNOSIS — Z6841 Body Mass Index (BMI) 40.0 and over, adult: Secondary | ICD-10-CM | POA: Diagnosis not present

## 2020-10-20 DIAGNOSIS — I10 Essential (primary) hypertension: Secondary | ICD-10-CM

## 2020-10-20 NOTE — Progress Notes (Signed)
Chief Complaint:   OBESITY Kaci is here to discuss her progress with her obesity treatment plan along with follow-up of her obesity related diagnoses. Delona is on the Category 4 Plan and states she is following her eating plan approximately 75% of the time. Arliene states she is biking and walking 10-15 minutes 2-4 times per week.  Today's visit was #: 4 Starting weight: 308 lbs Starting date: 08/26/2020 Today's weight: 291 lbs Today's date: 10/20/2020 Total lbs lost to date: 17 Total lbs lost since last in-office visit: 2  Interim History: Zeppelin has been trying to stay committed to the plan. She had a death in the family and so she ate off plan then. She did eat out a few times but otherwise is trying to choose more vegetable options. She is doing snack size chips or cake for snack calories. She is going to a weekend function in mid October with a church group.  Subjective:   1. Essential hypertension BP controlled today. Pt denies chest pain/chest pressure/headache.  2. Vitamin D deficiency Landis denies nausea, vomiting, and muscle weakness but notes fatigue. Pt is on prescription Vit D.  Assessment/Plan:   1. Essential hypertension Bridey is working on healthy weight loss and exercise to improve blood pressure control. We will watch for signs of hypotension as she continues her lifestyle modifications. Continue lisinopril with no change in dose. Continue HCTZ.  2. Vitamin D deficiency Low Vitamin D level contributes to fatigue and are associated with obesity, breast, and colon cancer. She agrees to continue to take prescription Vitamin D 50,000 IU every week and will follow-up for routine testing of Vitamin D, at least 2-3 times per year to avoid over-replacement.  3. Obesity with current BMI of 44.3  Jensyn is currently in the action stage of change. As such, her goal is to continue with weight loss efforts. She has agreed to the Category 4 Plan.   Exercise goals: All adults  should avoid inactivity. Some physical activity is better than none, and adults who participate in any amount of physical activity gain some health benefits.  Behavioral modification strategies: increasing lean protein intake, meal planning and cooking strategies, keeping healthy foods in the home, dealing with family or coworker sabotage, and planning for success.  Danyia has agreed to follow-up with our clinic in 2-3 weeks. She was informed of the importance of frequent follow-up visits to maximize her success with intensive lifestyle modifications for her multiple health conditions.   Objective:   Blood pressure 126/79, pulse 88, temperature 97.7 F (36.5 C), height 5\' 8"  (1.727 m), weight 291 lb (132 kg), SpO2 99 %. Body mass index is 44.25 kg/m.  General: Cooperative, alert, well developed, in no acute distress. HEENT: Conjunctivae and lids unremarkable. Cardiovascular: Regular rhythm.  Lungs: Normal work of breathing. Neurologic: No focal deficits.   Lab Results  Component Value Date   CREATININE 0.79 08/26/2020   BUN 12 08/26/2020   NA 139 08/26/2020   K 3.9 08/26/2020   CL 100 08/26/2020   CO2 24 08/26/2020   Lab Results  Component Value Date   ALT 104 (H) 08/26/2020   AST 92 (H) 08/26/2020   ALKPHOS 86 08/26/2020   BILITOT 0.6 08/26/2020   No results found for: HGBA1C Lab Results  Component Value Date   INSULIN 42.6 (H) 08/26/2020   No results found for: TSH No results found for: CHOL, HDL, LDLCALC, LDLDIRECT, TRIG, CHOLHDL Lab Results  Component Value Date  VD25OH 30.4 08/26/2020   Lab Results  Component Value Date   WBC 8.8 05/13/2014   HGB 13.2 05/13/2014   HCT 39.2 05/13/2014   MCV 85.4 05/13/2014   PLT 247 05/13/2014    Attestation Statements:   Reviewed by clinician on day of visit: allergies, medications, problem list, medical history, surgical history, family history, social history, and previous encounter notes.  Coral Ceo, CMA, am  acting as transcriptionist for Coralie Common, MD.  I have reviewed the above documentation for accuracy and completeness, and I agree with the above. - Coralie Common, MD

## 2020-11-10 ENCOUNTER — Other Ambulatory Visit: Payer: Self-pay

## 2020-11-10 ENCOUNTER — Ambulatory Visit (INDEPENDENT_AMBULATORY_CARE_PROVIDER_SITE_OTHER): Payer: 59 | Admitting: Family Medicine

## 2020-11-10 ENCOUNTER — Encounter (INDEPENDENT_AMBULATORY_CARE_PROVIDER_SITE_OTHER): Payer: Self-pay | Admitting: Family Medicine

## 2020-11-10 VITALS — BP 115/71 | HR 93 | Temp 98.0°F | Ht 68.0 in | Wt 290.0 lb

## 2020-11-10 DIAGNOSIS — E559 Vitamin D deficiency, unspecified: Secondary | ICD-10-CM

## 2020-11-10 DIAGNOSIS — Z6841 Body Mass Index (BMI) 40.0 and over, adult: Secondary | ICD-10-CM

## 2020-11-10 DIAGNOSIS — I10 Essential (primary) hypertension: Secondary | ICD-10-CM

## 2020-11-10 MED ORDER — VITAMIN D (ERGOCALCIFEROL) 1.25 MG (50000 UNIT) PO CAPS: 50000.0000 [IU] | ORAL_CAPSULE | ORAL | 0 refills | Status: DC

## 2020-11-10 NOTE — Progress Notes (Signed)
Chief Complaint:   OBESITY Nicole Pacheco is here to discuss her progress with her obesity treatment plan along with follow-up of her obesity related diagnoses. Nicole Pacheco is on the Category 4 Plan and states she is following her eating plan approximately 50% of the time. Nicole Pacheco states she is walking 20 minutes 5 times per week.  Today's visit was #: 5 Starting weight: 308 lbs Starting date: 08/26/2020 Today's weight: 290 lbs Today's date: 11/10/2020 Total lbs lost to date: 18 Total lbs lost since last in-office visit: 1  Interim History: Nicole Pacheco went out of town since her last appt. She felt concern about compliance to meal plan and possible weight gain. Her friend did some indulgent cake squares and brought them to work. She has a Teacher, English as a foreign language this upcoming weekend.   Subjective:   1. Vitamin D deficiency Pt denies nausea, vomiting, and muscle weakness but notes fatigue. She is on prescription Vit D. Her last Vit D level was 30.4.  2. Essential hypertension BP well controlled today. Pt denies chest pain/chest pressure/headache. Nicole Pacheco is on lisinopril and HCTZ.  Assessment/Plan:   1. Vitamin D deficiency Low Vitamin D level contributes to fatigue and are associated with obesity, breast, and colon cancer. She agrees to continue to take prescription Vitamin D 50,000 IU every week and will follow-up for routine testing of Vitamin D, at least 2-3 times per year to avoid over-replacement.  Refill- Vitamin D, Ergocalciferol, (DRISDOL) 1.25 MG (50000 UNIT) CAPS capsule; Take 1 capsule (50,000 Units total) by mouth every 7 (seven) days.  Dispense: 4 capsule; Refill: 0  2. Essential hypertension Nicole Pacheco is working on healthy weight loss and exercise to improve blood pressure control. We will watch for signs of hypotension as she continues her lifestyle modifications. Continue current treatment plan.  3. Obesity with current BMI of 44.2  Nicole Pacheco is currently in the action stage of change. As such, her  goal is to continue with weight loss efforts. She has agreed to the Category 4 Plan.   Exercise goals: All adults should avoid inactivity. Some physical activity is better than none, and adults who participate in any amount of physical activity gain some health benefits.  Behavioral modification strategies: increasing lean protein intake, meal planning and cooking strategies, keeping healthy foods in the home, and holiday eating strategies .  Nicole Pacheco has agreed to follow-up with our clinic in 2 weeks. She was informed of the importance of frequent follow-up visits to maximize her success with intensive lifestyle modifications for her multiple health conditions.   Objective:   Blood pressure 115/71, pulse 93, temperature 98 F (36.7 C), height 5\' 8"  (1.727 m), weight 290 lb (131.5 kg), SpO2 98 %. Body mass index is 44.09 kg/m.  General: Cooperative, alert, well developed, in no acute distress. HEENT: Conjunctivae and lids unremarkable. Cardiovascular: Regular rhythm.  Lungs: Normal work of breathing. Neurologic: No focal deficits.   Lab Results  Component Value Date   CREATININE 0.79 08/26/2020   BUN 12 08/26/2020   NA 139 08/26/2020   K 3.9 08/26/2020   CL 100 08/26/2020   CO2 24 08/26/2020   Lab Results  Component Value Date   ALT 104 (H) 08/26/2020   AST 92 (H) 08/26/2020   ALKPHOS 86 08/26/2020   BILITOT 0.6 08/26/2020   No results found for: HGBA1C Lab Results  Component Value Date   INSULIN 42.6 (H) 08/26/2020   No results found for: TSH No results found for: CHOL, HDL, LDLCALC, LDLDIRECT, TRIG,  [facility] Lab Results  Component Value Date   VD25OH 30.4 08/26/2020   Lab Results  Component Value Date   WBC 8.8 05/13/2014   HGB 13.2 05/13/2014   HCT 39.2 05/13/2014   MCV 85.4 05/13/2014   PLT 247 05/13/2014    Attestation Statements:   Reviewed by clinician on day of visit: allergies, medications, problem list, medical history, surgical history, family  history, social history, and previous encounter notes.  Time spent on visit including pre-visit chart review and post-visit care and charting was 25 minutes.   Coral Ceo, CMA, am acting as transcriptionist for Coralie Common, MD.  I have reviewed the above documentation for accuracy and completeness, and I agree with the above. - Coralie Common, MD

## 2020-11-24 ENCOUNTER — Encounter (INDEPENDENT_AMBULATORY_CARE_PROVIDER_SITE_OTHER): Payer: Self-pay | Admitting: Family Medicine

## 2020-11-24 ENCOUNTER — Ambulatory Visit (INDEPENDENT_AMBULATORY_CARE_PROVIDER_SITE_OTHER): Payer: 59 | Admitting: Family Medicine

## 2020-11-24 ENCOUNTER — Other Ambulatory Visit: Payer: Self-pay

## 2020-11-24 VITALS — BP 114/74 | HR 84 | Temp 97.8°F | Ht 68.0 in | Wt 287.0 lb

## 2020-11-24 DIAGNOSIS — R7401 Elevation of levels of liver transaminase levels: Secondary | ICD-10-CM

## 2020-11-24 DIAGNOSIS — I10 Essential (primary) hypertension: Secondary | ICD-10-CM

## 2020-11-24 DIAGNOSIS — Z6841 Body Mass Index (BMI) 40.0 and over, adult: Secondary | ICD-10-CM | POA: Diagnosis not present

## 2020-11-24 NOTE — Progress Notes (Signed)
Chief Complaint:   OBESITY Nicole Pacheco is here to discuss her progress with her obesity treatment plan along with follow-up of her obesity related diagnoses. Nicole Pacheco is on the Category 4 Plan and states she is following her eating plan approximately 65% of the time. Nicole Pacheco states she is walking 30 minutes 3 times per week.  Today's visit was #: 6 Starting weight: 308 lbs Starting date: 08/26/2020 Today's weight: 287 lbs Today's date: 11/24/2020 Total lbs lost to date: 21 Total lbs lost since last in-office visit: 3  Interim History: Nicole Pacheco has been mostly on some routine over the last few weeks. Pt does not necessarily think she only followed plan 65% of the time. Prior to Thanksgiving, she is planning to go to a football game. Pt is going to her father's for Thanksgiving. Her biggest upcoming obstacle will be leftovers after Thanksgiving.  Subjective:   1. Essential hypertension BP well controlled today. Pt denies chest pain/chest pressure/headache.  2. Transaminitis Kissie's LFT's within normal limits. She is not on statin therapy.  Assessment/Plan:   1. Essential hypertension Nicole Pacheco is working on healthy weight loss and exercise to improve blood pressure control. We will watch for signs of hypotension as she continues her lifestyle modifications. Continue current meds.  2. Transaminitis Follow up CMP in 2 months.  3. Obesity BMI today is 34  Nicole Pacheco is currently in the action stage of change. As such, her goal is to continue with weight loss efforts. She has agreed to the Category 4 Plan.   Exercise goals: All adults should avoid inactivity. Some physical activity is better than none, and adults who participate in any amount of physical activity gain some health benefits.  Behavioral modification strategies: increasing lean protein intake, meal planning and cooking strategies, keeping healthy foods in the home, and planning for success.  Nicole Pacheco has agreed to follow-up with our clinic  in 3 weeks. She was informed of the importance of frequent follow-up visits to maximize her success with intensive lifestyle modifications for her multiple health conditions.   Objective:   Blood pressure 114/74, pulse 84, temperature 97.8 F (36.6 C), height 5\' 8"  (1.727 m), weight 287 lb (130.2 kg), SpO2 99 %. Body mass index is 43.64 kg/m.  General: Cooperative, alert, well developed, in no acute distress. HEENT: Conjunctivae and lids unremarkable. Cardiovascular: Regular rhythm.  Lungs: Normal work of breathing. Neurologic: No focal deficits.   Lab Results  Component Value Date   CREATININE 0.79 08/26/2020   BUN 12 08/26/2020   NA 139 08/26/2020   K 3.9 08/26/2020   CL 100 08/26/2020   CO2 24 08/26/2020   Lab Results  Component Value Date   ALT 104 (H) 08/26/2020   AST 92 (H) 08/26/2020   ALKPHOS 86 08/26/2020   BILITOT 0.6 08/26/2020   No results found for: HGBA1C Lab Results  Component Value Date   INSULIN 42.6 (H) 08/26/2020   No results found for: TSH No results found for: CHOL, HDL, LDLCALC, LDLDIRECT, TRIG, CHOLHDL Lab Results  Component Value Date   VD25OH 30.4 08/26/2020   Lab Results  Component Value Date   WBC 8.8 05/13/2014   HGB 13.2 05/13/2014   HCT 39.2 05/13/2014   MCV 85.4 05/13/2014   PLT 247 05/13/2014    Attestation Statements:   Reviewed by clinician on day of visit: allergies, medications, problem list, medical history, surgical history, family history, social history, and previous encounter notes.  Coral Ceo, CMA, am acting as transcriptionist  for Coralie Common, MD.   I have reviewed the above documentation for accuracy and completeness, and I agree with the above. - Coralie Common, MD

## 2020-12-15 ENCOUNTER — Ambulatory Visit (INDEPENDENT_AMBULATORY_CARE_PROVIDER_SITE_OTHER): Payer: 59 | Admitting: Family Medicine

## 2020-12-21 ENCOUNTER — Other Ambulatory Visit (INDEPENDENT_AMBULATORY_CARE_PROVIDER_SITE_OTHER): Payer: Self-pay | Admitting: Family Medicine

## 2020-12-21 DIAGNOSIS — E559 Vitamin D deficiency, unspecified: Secondary | ICD-10-CM

## 2020-12-21 MED ORDER — VITAMIN D (ERGOCALCIFEROL) 1.25 MG (50000 UNIT) PO CAPS: 50000.0000 [IU] | ORAL_CAPSULE | ORAL | 0 refills | Status: DC

## 2020-12-21 NOTE — Telephone Encounter (Signed)
Pt last seen by Dr. Ukleja.  

## 2020-12-21 NOTE — Telephone Encounter (Signed)
LAST APPOINTMENT DATE: 11/24/20 NEXT APPOINTMENT DATE: 12/30/20   [facility] [address] [place] [address] Phone: [phone] Fax: [place], [place] - [address] [place] [address] Phone: [phone] Fax: [phone]  Patient is requesting a refill of the following medications: Requested Prescriptions   Pending Prescriptions Disp Refills   Vitamin D, Ergocalciferol, (DRISDOL) 1.25 MG (50000 UNIT) CAPS capsule 4 capsule 0    Sig: Take 1 capsule (50,000 Units total) by mouth every 7 (seven) days.    Date last filled: 11/10/20 Previously prescribed by Dr. Jearld Shines  No results found for: HGBA1C Lab Results  Component Value Date   CREATININE 0.79 08/26/2020   Lab Results  Component Value Date   VD25OH 30.4 08/26/2020    BP Readings from Last 3 Encounters:  11/24/20 114/74  11/10/20 115/71  10/20/20 126/79

## 2020-12-30 ENCOUNTER — Other Ambulatory Visit: Payer: Self-pay

## 2020-12-30 ENCOUNTER — Encounter (INDEPENDENT_AMBULATORY_CARE_PROVIDER_SITE_OTHER): Payer: Self-pay | Admitting: Family Medicine

## 2020-12-30 ENCOUNTER — Ambulatory Visit (INDEPENDENT_AMBULATORY_CARE_PROVIDER_SITE_OTHER): Payer: 59 | Admitting: Family Medicine

## 2020-12-30 VITALS — BP 115/73 | HR 80 | Temp 98.3°F | Ht 68.0 in | Wt 278.0 lb

## 2020-12-30 DIAGNOSIS — E559 Vitamin D deficiency, unspecified: Secondary | ICD-10-CM

## 2020-12-30 DIAGNOSIS — I10 Essential (primary) hypertension: Secondary | ICD-10-CM

## 2020-12-30 DIAGNOSIS — Z6841 Body Mass Index (BMI) 40.0 and over, adult: Secondary | ICD-10-CM | POA: Diagnosis not present

## 2020-12-30 MED ORDER — VITAMIN D (ERGOCALCIFEROL) 1.25 MG (50000 UNIT) PO CAPS: 50000.0000 [IU] | ORAL_CAPSULE | ORAL | 0 refills | Status: DC

## 2020-12-30 NOTE — Progress Notes (Signed)
Chief Complaint:   OBESITY Nicole Pacheco is here to discuss her progress with her obesity treatment plan along with follow-up of her obesity related diagnoses. Nicole Pacheco is on the Category 4 Plan and states she is following her eating plan approximately 50% of the time. Nicole Pacheco states she is biking 10 minutes 3 times per week.  Today's visit was #: 7 Starting weight: 308 lbs Starting date: 08/26/2020 Today's weight: 278 lbs Today's date: 12/30/2020 Total lbs lost to date: 30 Total lbs lost since last in-office visit: 9  Interim History: Nicole Pacheco is still dealing with URI and cough. Her last OV was 11/24/2020. She ate Thanksgiving and was mindful. Pt has recognized that there are many more indulgences around. She is going to her dad's on Christmas. Her biggest obstacle is increased indulgences at work.  Subjective:   1. Vitamin D deficiency Pt denies nausea, vomiting, and muscle weakness but notes fatigue. Her last Vit D level was 30.4.  2. Essential hypertension BP very well controlled. Pt denies chest pain/chest pressure/headache.  Assessment/Plan:   1. Vitamin D deficiency Low Vitamin D level contributes to fatigue and are associated with obesity, breast, and colon cancer. She agrees to continue to take prescription Vitamin D 50,000 IU every week and will follow-up for routine testing of Vitamin D, at least 2-3 times per year to avoid over-replacement.  Refill- Vitamin D, Ergocalciferol, (DRISDOL) 1.25 MG (50000 UNIT) CAPS capsule; Take 1 capsule (50,000 Units total) by mouth every 7 (seven) days.  Dispense: 4 capsule; Refill: 0  2. Essential hypertension Rashena is working on healthy weight loss and exercise to improve blood pressure control. We will watch for signs of hypotension as she continues her lifestyle modifications. Decrease lisinopril to 5 mg. Follow up on BP at next appt and if it is still well controlled, we will stop lisinopril.  3. Obesity with current BMI of 42.3  [name] is  currently in the action stage of change. As such, her goal is to continue with weight loss efforts. She has agreed to the Category 4 Plan.   Exercise goals: All adults should avoid inactivity. Some physical activity is better than none, and adults who participate in any amount of physical activity gain some health benefits. Discuss plan for physical activity at next appt.  Behavioral modification strategies: increasing lean protein intake, meal planning and cooking strategies, keeping healthy foods in the home, and planning for success.  Nicole Pacheco has agreed to follow-up with our clinic in 4 weeks. She was informed of the importance of frequent follow-up visits to maximize her success with intensive lifestyle modifications for her multiple health conditions.   Objective:   Blood pressure 115/73, pulse 80, temperature 98.3 F (36.8 C), height 5\' 8"  (1.727 m), weight 278 lb (126.1 kg), SpO2 98 %. Body mass index is 42.27 kg/m.  General: Cooperative, alert, well developed, in no acute distress. HEENT: Conjunctivae and lids unremarkable. Cardiovascular: Regular rhythm.  Lungs: Normal work of breathing. Neurologic: No focal deficits.   Lab Results  Component Value Date   CREATININE 0.79 08/26/2020   BUN 12 08/26/2020   NA 139 08/26/2020   K 3.9 08/26/2020   CL 100 08/26/2020   CO2 24 08/26/2020   Lab Results  Component Value Date   ALT 104 (H) 08/26/2020   AST 92 (H) 08/26/2020   ALKPHOS 86 08/26/2020   BILITOT 0.6 08/26/2020   No results found for: HGBA1C Lab Results  Component Value Date   INSULIN 42.6 (H)  08/26/2020   No results found for: TSH No results found for: CHOL, HDL, LDLCALC, LDLDIRECT, TRIG, CHOLHDL Lab Results  Component Value Date   VD25OH 30.4 08/26/2020   Lab Results  Component Value Date   WBC 8.8 05/13/2014   HGB 13.2 05/13/2014   HCT 39.2 05/13/2014   MCV 85.4 05/13/2014   PLT 247 05/13/2014    Attestation Statements:   Reviewed by clinician on  day of visit: allergies, medications, problem list, medical history, surgical history, family history, social history, and previous encounter notes.  Coral Ceo, CMA, am acting as transcriptionist for Coralie Common, MD.  I have reviewed the above documentation for accuracy and completeness, and I agree with the above. - Coralie Common, MD

## 2021-01-27 ENCOUNTER — Encounter (INDEPENDENT_AMBULATORY_CARE_PROVIDER_SITE_OTHER): Payer: Self-pay | Admitting: Family Medicine

## 2021-01-27 ENCOUNTER — Other Ambulatory Visit: Payer: Self-pay

## 2021-01-27 ENCOUNTER — Ambulatory Visit (INDEPENDENT_AMBULATORY_CARE_PROVIDER_SITE_OTHER): Payer: 59 | Admitting: Family Medicine

## 2021-01-27 VITALS — BP 115/71 | HR 75 | Temp 97.9°F | Ht 68.0 in | Wt 279.0 lb

## 2021-01-27 DIAGNOSIS — I10 Essential (primary) hypertension: Secondary | ICD-10-CM | POA: Diagnosis not present

## 2021-01-27 DIAGNOSIS — E669 Obesity, unspecified: Secondary | ICD-10-CM | POA: Diagnosis not present

## 2021-01-27 DIAGNOSIS — E559 Vitamin D deficiency, unspecified: Secondary | ICD-10-CM | POA: Diagnosis not present

## 2021-01-27 DIAGNOSIS — Z6841 Body Mass Index (BMI) 40.0 and over, adult: Secondary | ICD-10-CM

## 2021-01-27 MED ORDER — VITAMIN D (ERGOCALCIFEROL) 1.25 MG (50000 UNIT) PO CAPS: 50000.0000 [IU] | ORAL_CAPSULE | ORAL | 0 refills | Status: DC

## 2021-02-01 NOTE — Progress Notes (Signed)
Chief Complaint:   OBESITY Nicole Pacheco is here to discuss her progress with her obesity treatment plan along with follow-up of her obesity related diagnoses. Nicole Pacheco is on the Category 4 Plan and states she is following her eating plan approximately 35% of the time. Nicole Pacheco states she is not currently exercising.  Today's visit was #: 8 Starting weight: 308 lbs Starting date: 08/26/2020 Today's weight: 279 lbs Today's date: 01/27/2021 Total lbs lost to date: 29 Total lbs lost since last in-office visit: 0  Interim History: Pt had a good holiday. She mentions she did indulge over the holiday season but did get back on track after the festivities. Over the next few weeks, she has her husband's birthday. She has plans to travel to [place] in August. Pt wants to get back on category 4.  Subjective:   1. Essential hypertension BP well controlled today. Pt denies chest pain/chest pressure/headache. She is on HCTZ and lisinopril.  2. Vitamin D deficiency Pt denies nausea, vomiting, and muscle weakness but notes fatigue. She is on prescription Vit D.  Assessment/Plan:   1. Essential hypertension Nicole Pacheco is working on healthy weight loss and exercise to improve blood pressure control. We will watch for signs of hypotension as she continues her lifestyle modifications. Follow up on BP at next appt. If BP is still controlled, will stop lisinopril.  2. Vitamin D deficiency Low Vitamin D level contributes to fatigue and are associated with obesity, breast, and colon cancer. She agrees to continue to take prescription Vitamin D 50,000 IU every week and will follow-up for routine testing of Vitamin D, at least 2-3 times per year to avoid over-replacement.  Refill- Vitamin D, Ergocalciferol, (DRISDOL) 1.25 MG (50000 UNIT) CAPS capsule; Take 1 capsule (50,000 Units total) by mouth every 7 (seven) days.  Dispense: 4 capsule; Refill: 0  3. Obesity with current BMI of 42.5  Nicole Pacheco is currently in the action  stage of change. As such, her goal is to continue with weight loss efforts. She has agreed to the Category 4 Plan.   Exercise goals: All adults should avoid inactivity. Some physical activity is better than none, and adults who participate in any amount of physical activity gain some health benefits. Pt is to do 10  minutes of focused activity 3 times a week.  Behavioral modification strategies: increasing lean protein intake, meal planning and cooking strategies, keeping healthy foods in the home, and planning for success.  Nicole Pacheco has agreed to follow-up with our clinic in 3 weeks- fasting. She was informed of the importance of frequent follow-up visits to maximize her success with intensive lifestyle modifications for her multiple health conditions.   Objective:   Blood pressure 115/71, pulse 75, temperature 97.9 F (36.6 C), height 5\' 8"  (1.727 m), weight 279 lb (126.6 kg), SpO2 99 %. Body mass index is 42.42 kg/m.  General: Cooperative, alert, well developed, in no acute distress. HEENT: Conjunctivae and lids unremarkable. Cardiovascular: Regular rhythm.  Lungs: Normal work of breathing. Neurologic: No focal deficits.   Lab Results  Component Value Date   CREATININE 0.79 08/26/2020   BUN 12 08/26/2020   NA 139 08/26/2020   K 3.9 08/26/2020   CL 100 08/26/2020   CO2 24 08/26/2020   Lab Results  Component Value Date   ALT 104 (H) 08/26/2020   AST 92 (H) 08/26/2020   ALKPHOS 86 08/26/2020   BILITOT 0.6 08/26/2020   No results found for: HGBA1C Lab Results  Component Value Date  INSULIN 42.6 (H) 08/26/2020   No results found for: TSH No results found for: CHOL, HDL, LDLCALC, LDLDIRECT, TRIG, CHOLHDL Lab Results  Component Value Date   VD25OH 30.4 08/26/2020   Lab Results  Component Value Date   WBC 8.8 05/13/2014   HGB 13.2 05/13/2014   HCT 39.2 05/13/2014   MCV 85.4 05/13/2014   PLT 247 05/13/2014    Attestation Statements:   Reviewed by clinician on day of  visit: allergies, medications, problem list, medical history, surgical history, family history, social history, and previous encounter notes.  Coral Ceo, CMA, am acting as transcriptionist for Coralie Common, MD.  I have reviewed the above documentation for accuracy and completeness, and I agree with the above. - Coralie Common, MD

## 2021-02-22 ENCOUNTER — Encounter: Payer: Self-pay | Admitting: Dermatology

## 2021-02-22 ENCOUNTER — Other Ambulatory Visit: Payer: Self-pay

## 2021-02-22 ENCOUNTER — Ambulatory Visit: Payer: 59 | Admitting: Dermatology

## 2021-02-22 DIAGNOSIS — Z85828 Personal history of other malignant neoplasm of skin: Secondary | ICD-10-CM

## 2021-02-22 DIAGNOSIS — L400 Psoriasis vulgaris: Secondary | ICD-10-CM | POA: Diagnosis not present

## 2021-02-22 DIAGNOSIS — Z1283 Encounter for screening for malignant neoplasm of skin: Secondary | ICD-10-CM | POA: Diagnosis not present

## 2021-02-22 DIAGNOSIS — L738 Other specified follicular disorders: Secondary | ICD-10-CM

## 2021-02-22 DIAGNOSIS — L732 Hidradenitis suppurativa: Secondary | ICD-10-CM

## 2021-02-22 DIAGNOSIS — L570 Actinic keratosis: Secondary | ICD-10-CM | POA: Diagnosis not present

## 2021-02-22 DIAGNOSIS — L918 Other hypertrophic disorders of the skin: Secondary | ICD-10-CM

## 2021-02-22 DIAGNOSIS — L821 Other seborrheic keratosis: Secondary | ICD-10-CM

## 2021-02-23 NOTE — Progress Notes (Signed)
° °  Follow-Up Visit   Subjective  Nicole Pacheco is a 56 y.o. female who presents for the following: Annual Exam (No concerns. Personal history of bcc. ).  General skin check, several areas of concern. Location:  Duration:  Quality:  Associated Signs/Symptoms: Modifying Factors:  Severity:  Timing: Context:   Objective  Well appearing patient in no apparent distress; mood and affect are within normal limits. Scalp General skin examination, no atypical pigmented lesions or nonmelanoma skin cancer (new or recurrent).  Left Elbow - Posterior, Right Elbow - Posterior Small plaque psoriasis limited to elbows.  Torso - Posterior (Back) Several 5 mm flattopped brown papules with typical dermoscopy  Left Lower Eyelid, Right Lower Eyelid 1 mm pedunculated papules  Right Suprapubic Area Evidence of fibrosis and sinus tract formation above and below the inguinal folds and lower abdomen.  Head - Anterior (Face) No sign recurrence, no scar  Mid Frontal Scalp 4 mm pink hornlike crust    A full examination was performed including scalp, head, eyes, ears, nose, lips, neck, chest, axillae, abdomen, back, buttocks, bilateral upper extremities, bilateral lower extremities, hands, feet, fingers, toes, fingernails, and toenails. All findings within normal limits unless otherwise noted below.  Areas beneath undergarments not fully examined.   Assessment & Plan    Encounter for screening for malignant neoplasm of skin Scalp  Annual skin examination, encouraged to self examine twice annually.  Continue ultraviolet protection.  Psoriasis vulgaris Left Elbow - Posterior; Right Elbow - Posterior  Patient can contact me if she decides she wants a prescription topical  Seborrheic keratosis Torso - Posterior (Back)  Leave if stable  Skin tag (2) Left Lower Eyelid; Right Lower Eyelid  May choose to excise in future  Sebaceous gland hyperplasia Mid Forehead  Hidradenitis  suppurativa Right Suprapubic Area  She will contact me if she experiences a significant flare  History of basal cell carcinoma (BCC) Head - Anterior (Face)  Check as needed change  AK (actinic keratosis) Mid Frontal Scalp  Return for biopsy if freezing fails  Destruction of lesion - Mid Frontal Scalp Complexity: simple   Destruction method: cryotherapy   Informed consent: discussed and consent obtained   Timeout:  patient name, date of birth, surgical site, and procedure verified Lesion destroyed using liquid nitrogen: Yes   Cryotherapy cycles:  5 Outcome: patient tolerated procedure well with no complications   Post-procedure details: wound care instructions given        I, Lavonna Monarch, MD, have reviewed all documentation for this visit.  The documentation on 02/23/21 for the exam, diagnosis, procedures, and orders are all accurate and complete.

## 2021-02-24 ENCOUNTER — Encounter (INDEPENDENT_AMBULATORY_CARE_PROVIDER_SITE_OTHER): Payer: Self-pay | Admitting: Family Medicine

## 2021-02-24 ENCOUNTER — Ambulatory Visit (INDEPENDENT_AMBULATORY_CARE_PROVIDER_SITE_OTHER): Payer: 59 | Admitting: Family Medicine

## 2021-02-24 ENCOUNTER — Other Ambulatory Visit: Payer: Self-pay

## 2021-02-24 VITALS — BP 119/71 | HR 79 | Temp 97.7°F | Ht 68.0 in | Wt 277.0 lb

## 2021-02-24 DIAGNOSIS — Z6841 Body Mass Index (BMI) 40.0 and over, adult: Secondary | ICD-10-CM

## 2021-02-24 DIAGNOSIS — Z9189 Other specified personal risk factors, not elsewhere classified: Secondary | ICD-10-CM

## 2021-02-24 DIAGNOSIS — E782 Mixed hyperlipidemia: Secondary | ICD-10-CM | POA: Diagnosis not present

## 2021-02-24 DIAGNOSIS — E559 Vitamin D deficiency, unspecified: Secondary | ICD-10-CM | POA: Diagnosis not present

## 2021-02-24 DIAGNOSIS — R7401 Elevation of levels of liver transaminase levels: Secondary | ICD-10-CM

## 2021-02-24 DIAGNOSIS — E669 Obesity, unspecified: Secondary | ICD-10-CM

## 2021-02-24 DIAGNOSIS — R7303 Prediabetes: Secondary | ICD-10-CM

## 2021-02-24 NOTE — Progress Notes (Signed)
Chief Complaint:   OBESITY Nicole Pacheco is here to discuss her progress with her obesity treatment plan along with follow-up of her obesity related diagnoses. [name] is on the Category 4 Plan and states she is following her eating plan approximately 50% of the time. Nicole Pacheco states she is biking and walking 10 minutes 2 times per week.  Today's visit was #: 9 Starting weight: 308 lbs Starting date: 08/26/2020 Today's weight: 277 lbs Today's date: 02/24/2021 Total lbs lost to date: 31 Total lbs lost since last in-office visit: 2  Interim History: The last few weeks it has been harder for pt to stay on plan. She is cravings cookies, especially [employer] cookies. Her biggest struggle is dinner because of choices/options, like potatoes. Pt is wondering how to start incorporating some food options she previously had in her diet.  Subjective:   1. Mixed hyperlipidemia Pt had previously elevated LDL and low HDL. She is not on meds.  2. Transaminitis Her last AST was 92 and ALT 104. Pt has been working on food choices and weight loss.  3. Prediabetes Nicole Pacheco's last insulin level was 42.6 and she is not on meds.  4. Vitamin D deficiency Pt's last Vit D level was 30.4 and she reports fatigue.  5. At risk for activity intolerance Nicole Pacheco is at risk for exercise intolerance due to obesity.  Assessment/Plan:   1. Mixed hyperlipidemia Cardiovascular risk and specific lipid/LDL goals reviewed.  We discussed several lifestyle modifications today and Vonnetta will continue to work on diet, exercise and weight loss efforts. Orders and follow up as documented in patient record.   Counseling Intensive lifestyle modifications are the first line treatment for this issue. Dietary changes: Increase soluble fiber. Decrease simple carbohydrates. Exercise changes: Moderate to vigorous-intensity aerobic activity 150 minutes per week if tolerated. Lipid-lowering medications: see documented in medical record. Check  labs today.  - Lipid Panel With LDL/HDL Ratio  2. Transaminitis Check labs today.  - Comprehensive metabolic panel  3. Prediabetes [name] will continue to work on weight loss, exercise, and decreasing simple carbohydrates to help decrease the risk of diabetes. Check labs today.  - Hemoglobin A1c - Insulin, random  4. Vitamin D deficiency Low Vitamin D level contributes to fatigue and are associated with obesity, breast, and colon cancer. She agrees to continue to take prescription Vitamin D @50 ,000 IU every week and will follow-up for routine testing of Vitamin D, at least 2-3 times per year to avoid over-replacement. Check labs today.  - VITAMIN D 25 Hydroxy (Vit-D Deficiency, Fractures)  5. At risk for activity intolerance Nicole Pacheco was given approximately 15 minutes of exercise intolerance counseling today. She is 56 y.o. female and has risk factors exercise intolerance including obesity. We discussed intensive lifestyle modifications today with an emphasis on specific weight loss instructions and strategies. Nicole Pacheco will slowly increase activity as tolerated.  Repetitive spaced learning was employed today to elicit superior memory formation and behavioral change.   6. Obesity with current BMI of 42.2 Nicole Pacheco is currently in the action stage of change. As such, her goal is to continue with weight loss efforts. She has agreed to the Category 4 Plan and keeping a food journal and adhering to recommended goals of 550-700 calories and 50+ grams protein with supper.   Exercise goals:  As is- Try to increase frequency to 3 times a week.  Behavioral modification strategies: increasing lean protein intake, meal planning and cooking strategies, and keeping healthy foods in the home.  Nicole Pacheco has agreed to follow-up with our clinic in 3 weeks. She was informed of the importance of frequent follow-up visits to maximize her success with intensive lifestyle modifications for her multiple health  conditions.   Nicole Pacheco was informed we would discuss her lab results at her next visit unless there is a critical issue that needs to be addressed sooner. Nicole Pacheco agreed to keep her next visit at the agreed upon time to discuss these results.  Objective:   Blood pressure 119/71, pulse 79, temperature 97.7 F (36.5 C), height 5\' 8"  (1.727 m), weight 277 lb (125.6 kg), SpO2 99 %. Body mass index is 42.12 kg/m.  General: Cooperative, alert, well developed, in no acute distress. HEENT: Conjunctivae and lids unremarkable. Cardiovascular: Regular rhythm.  Lungs: Normal work of breathing. Neurologic: No focal deficits.   Lab Results  Component Value Date   CREATININE 0.79 08/26/2020   BUN 12 08/26/2020   NA 139 08/26/2020   K 3.9 08/26/2020   CL 100 08/26/2020   CO2 24 08/26/2020   Lab Results  Component Value Date   ALT 104 (H) 08/26/2020   AST 92 (H) 08/26/2020   ALKPHOS 86 08/26/2020   BILITOT 0.6 08/26/2020   No results found for: HGBA1C Lab Results  Component Value Date   INSULIN 42.6 (H) 08/26/2020   No results found for: TSH No results found for: CHOL, HDL, LDLCALC, LDLDIRECT, TRIG, CHOLHDL Lab Results  Component Value Date   VD25OH 30.4 08/26/2020   Lab Results  Component Value Date   WBC 8.8 05/13/2014   HGB 13.2 05/13/2014   HCT 39.2 05/13/2014   MCV 85.4 05/13/2014   PLT 247 05/13/2014   Attestation Statements:   Reviewed by clinician on day of visit: allergies, medications, problem list, medical history, surgical history, family history, social history, and previous encounter notes.  Coral Ceo, CMA, am acting as transcriptionist for Nicole Common, MD.   I have reviewed the above documentation for accuracy and completeness, and I agree with the above. - Nicole Common, MD

## 2021-02-25 LAB — LIPID PANEL WITH LDL/HDL RATIO

## 2021-02-26 LAB — HEMOGLOBIN A1C
Est. average glucose Bld gHb Est-mCnc: 111 mg/dL
Hgb A1c MFr Bld: 5.5 % (ref 4.8–5.6)

## 2021-02-26 LAB — COMPREHENSIVE METABOLIC PANEL
ALT: 22 IU/L (ref 0–32)
AST: 24 IU/L (ref 0–40)
Albumin/Globulin Ratio: 2 (ref 1.2–2.2)
Albumin: 4.7 g/dL (ref 3.8–4.9)
Alkaline Phosphatase: 89 IU/L (ref 44–121)
BUN/Creatinine Ratio: 16 (ref 9–23)
BUN: 12 mg/dL (ref 6–24)
Bilirubin Total: 0.6 mg/dL (ref 0.0–1.2)
CO2: 22 mmol/L (ref 20–29)
Calcium: 9.3 mg/dL (ref 8.7–10.2)
Chloride: 102 mmol/L (ref 96–106)
Creatinine, Ser: 0.76 mg/dL (ref 0.57–1.00)
Globulin, Total: 2.4 g/dL (ref 1.5–4.5)
Glucose: 117 mg/dL — ABNORMAL HIGH (ref 70–99)
Potassium: 4.2 mmol/L (ref 3.5–5.2)
Sodium: 142 mmol/L (ref 134–144)
Total Protein: 7.1 g/dL (ref 6.0–8.5)
eGFR: 92 mL/min/{1.73_m2} (ref >59)

## 2021-02-26 LAB — VITAMIN D 25 HYDROXY (VIT D DEFICIENCY, FRACTURES): Vit D, 25-Hydroxy: 41.6 ng/mL (ref 30.0–100.0)

## 2021-02-26 LAB — LIPID PANEL WITH LDL/HDL RATIO
Cholesterol, Total: 180 mg/dL (ref 100–199)
HDL: 53 mg/dL (ref >39)
LDL Chol Calc (NIH): 106 mg/dL — ABNORMAL HIGH (ref 0–99)
LDL/HDL Ratio: 2 ratio (ref 0.0–3.2)
Triglycerides: 118 mg/dL (ref 0–149)
VLDL Cholesterol Cal: 21 mg/dL (ref 5–40)

## 2021-02-26 LAB — INSULIN, RANDOM: INSULIN: 26.4 u[IU]/mL — ABNORMAL HIGH (ref 2.6–24.9)

## 2021-03-24 ENCOUNTER — Ambulatory Visit (INDEPENDENT_AMBULATORY_CARE_PROVIDER_SITE_OTHER): Payer: 59 | Admitting: Family Medicine

## 2021-03-24 ENCOUNTER — Encounter (INDEPENDENT_AMBULATORY_CARE_PROVIDER_SITE_OTHER): Payer: Self-pay | Admitting: Family Medicine

## 2021-03-24 ENCOUNTER — Other Ambulatory Visit: Payer: Self-pay

## 2021-03-24 VITALS — BP 121/76 | HR 70 | Temp 97.9°F | Ht 68.0 in | Wt 278.0 lb

## 2021-03-24 DIAGNOSIS — R7303 Prediabetes: Secondary | ICD-10-CM | POA: Diagnosis not present

## 2021-03-24 DIAGNOSIS — Z6841 Body Mass Index (BMI) 40.0 and over, adult: Secondary | ICD-10-CM

## 2021-03-24 DIAGNOSIS — E7849 Other hyperlipidemia: Secondary | ICD-10-CM

## 2021-03-24 DIAGNOSIS — R7401 Elevation of levels of liver transaminase levels: Secondary | ICD-10-CM | POA: Diagnosis not present

## 2021-03-24 DIAGNOSIS — E669 Obesity, unspecified: Secondary | ICD-10-CM

## 2021-03-24 DIAGNOSIS — E559 Vitamin D deficiency, unspecified: Secondary | ICD-10-CM | POA: Diagnosis not present

## 2021-03-24 MED ORDER — VITAMIN D (ERGOCALCIFEROL) 1.25 MG (50000 UNIT) PO CAPS: 50000.0000 [IU] | ORAL_CAPSULE | ORAL | 0 refills | Status: DC

## 2021-03-24 NOTE — Progress Notes (Unsigned)
Chief Complaint:   OBESITY Nicole Pacheco is here to discuss her progress with her obesity treatment plan along with follow-up of her obesity related diagnoses. Kellyanne is on the Category 4 Plan and states she is following her eating plan approximately 50% of the time. Autum states she is walking or riding a bike for 10 minutes 2 times per week.  Today's visit was #: 10 Starting weight: 308 Starting date: 08/26/2020 Today's weight: 278 lbs Today's date: 03/24/2021 Total lbs lost to date: 30 Total lbs lost since last in-office visit: 1  Interim History: Lilliahna did go to [facility] since last appointment. She has noticed some struggles with motivation.  Mionna has started walking and riding a bike for 10 mins 2x's a week.  Mimie is still noticing snacking in the evenings to be an issue that she continues to struggle with. She is getting breakfast and lunch in on her plan and maybe 1 snack.  Subjective:   1. Vitamin D deficiency Abigal's Vit D level last month was of 41. She denies nausea, vomiting and muscle weakness. Notes fatigue. I discussed labs with Danicia today.  2. Prediabetes Hally's A1C has improved from 5.8 to 5.5. She not on any medications. I discussed labs with Costella today.  3. Other hyperlipidemia Yena's LDL is at 106, HDL at 5.3.  She is almost at goal without medications. I discussed labs with Nuri today.  4. Transaminitis Latise's AST and ALT has Improved. No record of imaging. I discussed labs with Chandani today.  Assessment/Plan:   1. Vitamin D deficiency Low Vitamin D level contributes to fatigue and are associated with obesity, breast, and colon cancer. She agrees to continue to take prescription Vitamin D '@50'$ ,000 IU every week and will follow-up for routine testing of Vitamin D, at least 2-3 times per year to avoid over-replacement.  - Refill Vitamin D, Ergocalciferol, (DRISDOL) 1.25 MG (50000 UNIT) CAPS capsule; Take 1 capsule (50,000 Units total) by mouth every 7  (seven) days.  Dispense: 4 capsule; Refill: 0  2. Prediabetes Tayonna will continue her cat meal plan, and continue to work on weight loss, exercise, and decreasing simple carbohydrates to help decrease the risk of diabetes.   3. Other hyperlipidemia Cardiovascular risk and specific lipid/LDL goals reviewed.  We discussed several lifestyle modifications today.  Shanna will continue her Cat 4 meal plan, and continue  to work on diet, exercise and weight loss efforts. Levels are not at goal, we will repeat labs in 3 to 4 months. Orders and follow up as documented in patient record.   Counseling Intensive lifestyle modifications are the first line treatment for this issue. Dietary changes: Increase soluble fiber. Decrease simple carbohydrates. Exercise changes: Moderate to vigorous-intensity aerobic activity 150 minutes per week if tolerated. Lipid-lowering medications: see documented in medical record.  4. Transaminitis We will repeat labs in 3 to 4 months.  5. Obesity with current BMI of 42.3 Glena is currently in the action stage of change. As such, her goal is to continue with weight loss efforts. She has agreed to the Category 4 Plan.   Exercise goals: As is, increase walking and biking to 3x's a week.  Behavioral modification strategies: increasing lean protein intake, meal planning and cooking strategies, and better snacking choices.  Clorine has agreed to follow-up with our clinic in 3 weeks. She was informed of the importance of frequent follow-up visits to maximize her success with intensive lifestyle modifications for her multiple health conditions.  Objective:   Blood pressure 121/76, pulse 70, temperature 97.9 F (36.6 C), height '5\' 8"'$  (1.727 m), weight 278 lb (126.1 kg), SpO2 98 %. Body mass index is 42.27 kg/m.  General: Cooperative, alert, well developed, in no acute distress. HEENT: Conjunctivae and lids unremarkable. Cardiovascular: Regular rhythm.  Lungs: Normal work of  breathing. Neurologic: No focal deficits.   Lab Results  Component Value Date   CREATININE 0.76 02/24/2021   BUN 12 02/24/2021   NA 142 02/24/2021   K 4.2 02/24/2021   CL 102 02/24/2021   CO2 22 02/24/2021   Lab Results  Component Value Date   ALT 22 02/24/2021   AST 24 02/24/2021   ALKPHOS 89 02/24/2021   BILITOT 0.6 02/24/2021   Lab Results  Component Value Date   HGBA1C 5.5 02/24/2021   Lab Results  Component Value Date   INSULIN 26.4 (H) 02/24/2021   INSULIN 42.6 (H) 08/26/2020   No results found for: TSH Lab Results  Component Value Date   CHOL 180 02/24/2021   HDL 53 02/24/2021   LDLCALC 106 (H) 02/24/2021   TRIG 118 02/24/2021   Lab Results  Component Value Date   VD25OH 41.6 02/24/2021   VD25OH 30.4 08/26/2020   Lab Results  Component Value Date   WBC 8.8 05/13/2014   HGB 13.2 05/13/2014   HCT 39.2 05/13/2014   MCV 85.4 05/13/2014   PLT 247 05/13/2014   No results found for: IRON, TIBC, FERRITIN.  Attestation Statements:   Reviewed by clinician on day of visit: allergies, medications, problem list, medical history, surgical history, family history, social history, and previous encounter notes.   I, Shelley Cocke, am acting as transcriptionist for Coralie Common, MD.  I have reviewed the above documentation for accuracy and completeness, and I agree with the above. -  ***

## 2021-04-14 ENCOUNTER — Encounter (INDEPENDENT_AMBULATORY_CARE_PROVIDER_SITE_OTHER): Payer: Self-pay | Admitting: Family Medicine

## 2021-04-14 ENCOUNTER — Ambulatory Visit (INDEPENDENT_AMBULATORY_CARE_PROVIDER_SITE_OTHER): Payer: 59 | Admitting: Family Medicine

## 2021-04-14 VITALS — BP 108/68 | HR 69 | Temp 98.0°F | Ht 68.0 in | Wt 277.0 lb

## 2021-04-14 DIAGNOSIS — Z6841 Body Mass Index (BMI) 40.0 and over, adult: Secondary | ICD-10-CM

## 2021-04-14 DIAGNOSIS — Z9189 Other specified personal risk factors, not elsewhere classified: Secondary | ICD-10-CM | POA: Diagnosis not present

## 2021-04-14 DIAGNOSIS — E559 Vitamin D deficiency, unspecified: Secondary | ICD-10-CM

## 2021-04-14 DIAGNOSIS — R7303 Prediabetes: Secondary | ICD-10-CM

## 2021-04-14 DIAGNOSIS — E669 Obesity, unspecified: Secondary | ICD-10-CM

## 2021-04-14 MED ORDER — VITAMIN D (ERGOCALCIFEROL) 1.25 MG (50000 UNIT) PO CAPS: 50000.0000 [IU] | ORAL_CAPSULE | ORAL | 0 refills | Status: DC

## 2021-04-19 NOTE — Progress Notes (Signed)
? ? ? ?Chief Complaint:  ? ?OBESITY ?Nicole Pacheco is here to discuss her progress with her obesity treatment plan along with follow-up of her obesity related diagnoses. Nicole Pacheco is on the Category 4 Plan and states she is following her eating plan approximately 50% of the time. Nicole Pacheco states she is walking and riding a bike, 20 minutes 2-3 times per week. ? ?Today's visit was #: 06 ?Starting weight: 308 lbs ?Starting date: 08/26/2020 ?Today's weight: 277 lbs ?Today's date: 04/14/2021 ?Total lbs lost to date: 20 ?Total lbs lost since last in-office visit: 1 ? ?Interim History: Nicole Pacheco's last few weeks have been relatively ok. Her dad fell this past weekend and needed to go to the hospital, (orbital fracture). She has not been following the plan as strictly as she's been at her dads. She has been rotating staying at home with dad. Nicole Pacheco is coming up, Nicole Pacheco denies obstacles. ? ?Subjective:  ? ?1. Vitamin D deficiency ?Nicole Pacheco is currently taking prescription Vit D. Denies any nausea, vomiting or muscle weakness but notes fatigue. ? ?2. Prediabetes ?Nicole Pacheco's A1C at 5.5 and Insulin at 26.4, without meds. ? ?3. At risk for activity intolerance ?Nicole Pacheco  ? ? ?Assessment/Plan:  ? ?1. Vitamin D deficiency ?We will refill prescription Vit D for 1 month with no refill. ? ?- Refill Vitamin D, Ergocalciferol, (DRISDOL) 1.25 MG (50000 UNIT) CAPS capsule; Take 1 capsule (50,000 Units total) by mouth every 7 (seven) days.  Dispense: 8 capsule; Refill: 0 ? ?2. Prediabetes ?Nicole Pacheco is to continue on Category 4 with no meds at this time. ? ?3. At risk for activity intolerance ?Nicole Pacheco was given approximately 15 minutes of exercise intolerance counseling today. She is 56 y.o. female and has risk factors exercise intolerance including obesity. We discussed intensive lifestyle modifications today with an emphasis on specific weight loss instructions and strategies. Nicole Pacheco will slowly increase activity as tolerated. ? ?Repetitive spaced learning was employed  today to elicit superior memory formation and behavioral change. ? ?4. Obesity with current BMI of 42.1 ?Nicole Pacheco is currently in the action stage of change. As such, her goal is to continue with weight loss efforts. She has agreed to the Category 4 Plan.  ? ?Exercise goals: All adults should avoid inactivity. Some physical activity is better than none, and adults who participate in any amount of physical activity gain some health benefits. Add in some resistance training 10-15 minutes 3 times a week. ? ?Behavioral modification strategies: increasing lean protein intake, meal planning and cooking strategies, keeping healthy foods in the home, and planning for success. ? ?Nicole Pacheco has agreed to follow-up with our clinic in 3 weeks. She was informed of the importance of frequent follow-up visits to maximize her success with intensive lifestyle modifications for her multiple health conditions.  ? ?Objective:  ? ?Blood pressure 108/68, pulse 69, temperature 98 ?F (36.7 ?C), height '5\' 8"'$  (1.727 m), weight 277 lb (125.6 kg), SpO2 98 %. ?Body mass index is 42.12 kg/m?. ? ?General: Cooperative, alert, well developed, in no acute distress. ?HEENT: Conjunctivae and lids unremarkable. ?Cardiovascular: Regular rhythm.  ?Lungs: Normal work of breathing. ?Neurologic: No focal deficits.  ? ?Lab Results  ?Component Value Date  ? CREATININE 0.76 02/24/2021  ? BUN 12 02/24/2021  ? NA 142 02/24/2021  ? K 4.2 02/24/2021  ? CL 102 02/24/2021  ? CO2 22 02/24/2021  ? ?Lab Results  ?Component Value Date  ? ALT 22 02/24/2021  ? AST 24 02/24/2021  ? ALKPHOS 89 02/24/2021  ? BILITOT  0.6 02/24/2021  ? ?Lab Results  ?Component Value Date  ? HGBA1C 5.5 02/24/2021  ? ?Lab Results  ?Component Value Date  ? INSULIN 26.4 (H) 02/24/2021  ? INSULIN 42.6 (H) 08/26/2020  ? ?No results found for: TSH ?Lab Results  ?Component Value Date  ? CHOL 180 02/24/2021  ? HDL 53 02/24/2021  ? LDLCALC 106 (H) 02/24/2021  ? TRIG 118 02/24/2021  ? ?Lab Results  ?Component  Value Date  ? VD25OH 41.6 02/24/2021  ? VD25OH 30.4 08/26/2020  ? ?Lab Results  ?Component Value Date  ? WBC 8.8 05/13/2014  ? HGB 13.2 05/13/2014  ? HCT 39.2 05/13/2014  ? MCV 85.4 05/13/2014  ? PLT 247 05/13/2014  ? ?No results found for: IRON, TIBC, FERRITIN ? ?Attestation Statements:  ? ?Reviewed by clinician on day of visit: allergies, medications, problem list, medical history, surgical history, family history, social history, and previous encounter notes. ? ?I, Brendell Tyus, am acting as transcriptionist for Coralie Common, MD. ? ?I have reviewed the above documentation for accuracy and completeness, and I agree with the above. Coralie Common, MD ? ?

## 2021-05-06 ENCOUNTER — Encounter (INDEPENDENT_AMBULATORY_CARE_PROVIDER_SITE_OTHER): Payer: Self-pay | Admitting: Family Medicine

## 2021-05-06 ENCOUNTER — Ambulatory Visit (INDEPENDENT_AMBULATORY_CARE_PROVIDER_SITE_OTHER): Payer: 59 | Admitting: Family Medicine

## 2021-05-06 VITALS — BP 100/61 | HR 76 | Temp 98.2°F | Ht 68.0 in | Wt 272.0 lb

## 2021-05-06 DIAGNOSIS — E669 Obesity, unspecified: Secondary | ICD-10-CM

## 2021-05-06 DIAGNOSIS — Z720 Tobacco use: Secondary | ICD-10-CM | POA: Diagnosis not present

## 2021-05-06 DIAGNOSIS — Z6841 Body Mass Index (BMI) 40.0 and over, adult: Secondary | ICD-10-CM | POA: Diagnosis not present

## 2021-05-06 DIAGNOSIS — Z9189 Other specified personal risk factors, not elsewhere classified: Secondary | ICD-10-CM

## 2021-05-06 DIAGNOSIS — I10 Essential (primary) hypertension: Secondary | ICD-10-CM | POA: Diagnosis not present

## 2021-05-06 MED ORDER — HYDROCHLOROTHIAZIDE 25 MG PO TABS: 12.5000 mg | ORAL_TABLET | Freq: Every day | ORAL | Status: AC

## 2021-05-19 NOTE — Progress Notes (Signed)
Chief Complaint:   OBESITY Nicole Pacheco is here to discuss her progress with her obesity treatment plan along with follow-up of her obesity related diagnoses. Nicole Pacheco is on the Category 4 Plan and states she is following her eating plan approximately 75% of the time. Nicole Pacheco states she is walking 10 minutes 3 times per week.  Today's visit was #: 12 Starting weight: 308 lbs Starting date: 08/26/2020 Today's weight: 272 lbs Today's date:  05/06/2021 Total lbs lost to date: 36 Total lbs lost since last in-office visit: 5  Interim History: Nicole Pacheco has mostly been taking care of her father since last appointment.  He needed to have a pacemaker and then her aunt died.  She has been trying to stick to the plan as closely as she can. Nicole Pacheco has been getting some walking in, but has not gotten in much resistance training yet.  Wants to stick to meal plan for the next few weeks.   Subjective:   1. Essential hypertension Nicole Pacheco's blood pressure was very well controlled today.  She is on lisinopril 5 mg and HCTZ 25 mg.   2. Tobacco abuse Nicole Pacheco is still smoking 1/2-3/4 packs per day.  Nicole Pacheco has noticed that she does better when outside the home for decreased smoking frequency.  3. At risk for hypertension The patient is at a higher than average risk of hypertension due to cigarette smoking.     Assessment/Plan:   1. Essential hypertension Nicole Pacheco has agreed to decrease HCTZ to 12.5 Nicole Pacheco is cut current pills in half). Follow up with blood pressure at next appointment.   - hydrochlorothiazide (HYDRODIURIL) 25 MG tablet; Take 0.5 tablets (12.5 mg total) by mouth daily.  2. Tobacco abuse Nicole Pacheco will consider other "time outs"/ coping strategies for decreasing her cigarette smoking.   3. At risk for hypertension Nicole Pacheco was given approximately 15 minutes of education and counseling today to help avoid hypertension. We discussed risks of hypertension with cigarette smoking and signs of hypertension such as  chest pain or shortness of breath.  Repetitive spaced learning was employed today to elicit superior memory formation and behavioral change.   4. Obesity with current BMI of 41.4 Nicole Pacheco is currently in the action stage of change. As such, her goal is to continue with weight loss efforts. She has agreed to the Category 4 Plan.   Exercise goals: All adults should avoid inactivity. Some physical activity is better than none, and adults who participate in any amount of physical activity gain some health benefits.  Behavioral modification strategies: increasing lean protein intake, meal planning and cooking strategies, keeping healthy foods in the home, and emotional eating strategies.  Nicole Pacheco has agreed to follow-up with our clinic in 4 weeks. She was informed of the importance of frequent follow-up visits to maximize her success with intensive lifestyle modifications for her multiple health conditions.  Objective:   Blood pressure 100/61, pulse 76, temperature 98.2 F (36.8 C), height '5\' 8"'$  (1.727 m), weight 272 lb (123.4 kg), SpO2 98 %. Body mass index is 41.36 kg/m.  General: Cooperative, alert, well developed, in no acute distress. HEENT: Conjunctivae and lids unremarkable. Cardiovascular: Regular rhythm.  Lungs: Normal work of breathing. Neurologic: No focal deficits.   Lab Results  Component Value Date   CREATININE 0.76 02/24/2021   BUN 12 02/24/2021   NA 142 02/24/2021   K 4.2 02/24/2021   CL 102 02/24/2021   CO2 22 02/24/2021   Lab Results  Component Value Date  ALT 22 02/24/2021   AST 24 02/24/2021   ALKPHOS 89 02/24/2021   BILITOT 0.6 02/24/2021   Lab Results  Component Value Date   HGBA1C 5.5 02/24/2021   Lab Results  Component Value Date   INSULIN 26.4 (H) 02/24/2021   INSULIN 42.6 (H) 08/26/2020   No results found for: TSH Lab Results  Component Value Date   CHOL 180 02/24/2021   HDL 53 02/24/2021   LDLCALC 106 (H) 02/24/2021   TRIG 118 02/24/2021    Lab Results  Component Value Date   VD25OH 41.6 02/24/2021   VD25OH 30.4 08/26/2020   Lab Results  Component Value Date   WBC 8.8 05/13/2014   HGB 13.2 05/13/2014   HCT 39.2 05/13/2014   MCV 85.4 05/13/2014   PLT 247 05/13/2014   No results found for: IRON, TIBC, FERRITIN  Attestation Statements:   Reviewed by clinician on day of visit: allergies, medications, problem list, medical history, surgical history, family history, social history, and previous encounter notes.  I, Nicole Pacheco, am acting as transcriptionist for Nicole Common, MD.  I have reviewed the above documentation for accuracy and completeness, and I agree with the above. - Nicole Common, MD

## 2021-05-25 ENCOUNTER — Other Ambulatory Visit (INDEPENDENT_AMBULATORY_CARE_PROVIDER_SITE_OTHER): Payer: Self-pay | Admitting: Family Medicine

## 2021-05-25 DIAGNOSIS — E559 Vitamin D deficiency, unspecified: Secondary | ICD-10-CM

## 2021-06-01 ENCOUNTER — Ambulatory Visit (INDEPENDENT_AMBULATORY_CARE_PROVIDER_SITE_OTHER): Payer: 59 | Admitting: Family Medicine

## 2021-06-01 ENCOUNTER — Encounter (INDEPENDENT_AMBULATORY_CARE_PROVIDER_SITE_OTHER): Payer: Self-pay | Admitting: Family Medicine

## 2021-06-01 VITALS — BP 112/70 | HR 74 | Temp 98.0°F | Ht 68.0 in | Wt 273.0 lb

## 2021-06-01 DIAGNOSIS — E559 Vitamin D deficiency, unspecified: Secondary | ICD-10-CM | POA: Diagnosis not present

## 2021-06-01 DIAGNOSIS — Z6841 Body Mass Index (BMI) 40.0 and over, adult: Secondary | ICD-10-CM | POA: Diagnosis not present

## 2021-06-01 DIAGNOSIS — E669 Obesity, unspecified: Secondary | ICD-10-CM

## 2021-06-01 DIAGNOSIS — Z9189 Other specified personal risk factors, not elsewhere classified: Secondary | ICD-10-CM

## 2021-06-01 DIAGNOSIS — I10 Essential (primary) hypertension: Secondary | ICD-10-CM

## 2021-06-01 MED ORDER — VITAMIN D (ERGOCALCIFEROL) 1.25 MG (50000 UNIT) PO CAPS: 50000.0000 [IU] | ORAL_CAPSULE | ORAL | 0 refills | Status: DC

## 2021-06-07 NOTE — Progress Notes (Signed)
Chief Complaint:   OBESITY Nicole Pacheco is here to discuss her progress with her obesity treatment plan along with follow-up of her obesity related diagnoses. Nicole Pacheco is on the Category 4 Plan and states she is following her eating plan approximately 65% of the time. Nicole Pacheco states she is walking for 10 minutes 3 times per week.  Today's visit was #: 31 Starting weight: 308 lbs Starting date: 08/26/2020 Today's weight: 273 lbs Today's date: 06/01/2021 Total lbs lost to date: 35 lbs Total lbs lost since last in-office visit: 0  Interim History: Norlene had a bittersweet Mother's Day her daughter surprised her but it was the anniversary of her Mother's death. She felt she was being pretty compliant on plan but disappointed with weight gain. She went ou 2 times for Mother's Day. She is still taking care of her father. She recognizes what she needs to do but obstacles are getting started.   Subjective:   1. Vitamin D deficiency Nicole Pacheco's Vitamin D level was of 41.6. She notes fatigue. She is on prescription Vitamin D 50,000 IU.   2. Essential hypertension Nicole Pacheco's blood pressure is controlled today on 1/2 dose of medication. Her blood pressure today was 112/70. She is currently on 5 mg lisinopril and 12.5 mg HCTZ.   3. At risk for impaired function of liver Nicole Pacheco is at risk for impaired function of liver due to likely diagnosis of fatty liver as evidenced by recent elevated liver enzymes.   Assessment/Plan:   1. Vitamin D deficiency Low Vitamin D level contributes to fatigue and are associated with obesity, breast, and colon cancer. We will refill prescription Vitamin D 50,000 IU every week for 1 month with no refills and Fredericka will follow-up for routine testing of Vitamin D, at least 2-3 times per year to avoid over-replacement.  - Vitamin D, Ergocalciferol, (DRISDOL) 1.25 MG (50000 UNIT) CAPS capsule; Take 1 capsule (50,000 Units total) by mouth every 7 (seven) days.  Dispense: 8 capsule; Refill:  0  2. Essential hypertension Nicole Pacheco will continue with currently medications at current doses. She is working on healthy weight loss and exercise to improve blood pressure control. We will watch for signs of hypotension as she continues her lifestyle modifications.  3. At risk for impaired function of liver Nicole Pacheco was given approximately 15 minutes of counseling today regarding prevention of impaired liver function. Nicole Pacheco was educated about her risk of developing NASH or even liver failure and advised that the only proven treatment for NAFLD was weight loss of at least 5-10% of body weight.    4. Obesity with current BMI of 41.6 Nicole Pacheco is currently in the action stage of change. As such, her goal is to continue with weight loss efforts. She has agreed to the Category 4 Plan.   Exercise goals: All adults should avoid inactivity. Some physical activity is better than none, and adults who participate in any amount of physical activity gain some health benefits. Nicole Pacheco agrees to increase resistance training to 4 times per week.   Behavioral modification strategies: increasing lean protein intake, meal planning and cooking strategies, keeping healthy foods in the home, and planning for success.  Nicole Pacheco has agreed to follow-up with our clinic in 3-4 weeks. She was informed of the importance of frequent follow-up visits to maximize her success with intensive lifestyle modifications for her multiple health conditions.   Objective:   Blood pressure 112/70, pulse 74, temperature 98 F (36.7 C), height '5\' 8"'$  (1.727 m), weight 273 lb (  123.8 kg), SpO2 99 %. Body mass index is 41.51 kg/m.  General: Cooperative, alert, well developed, in no acute distress. HEENT: Conjunctivae and lids unremarkable. Cardiovascular: Regular rhythm.  Lungs: Normal work of breathing. Neurologic: No focal deficits.   Lab Results  Component Value Date   CREATININE 0.76 02/24/2021   BUN 12 02/24/2021   NA 142 02/24/2021   K  4.2 02/24/2021   CL 102 02/24/2021   CO2 22 02/24/2021   Lab Results  Component Value Date   ALT 22 02/24/2021   AST 24 02/24/2021   ALKPHOS 89 02/24/2021   BILITOT 0.6 02/24/2021   Lab Results  Component Value Date   HGBA1C 5.5 02/24/2021   Lab Results  Component Value Date   INSULIN 26.4 (H) 02/24/2021   INSULIN 42.6 (H) 08/26/2020   No results found for: TSH Lab Results  Component Value Date   CHOL 180 02/24/2021   HDL 53 02/24/2021   LDLCALC 106 (H) 02/24/2021   TRIG 118 02/24/2021   Lab Results  Component Value Date   VD25OH 41.6 02/24/2021   VD25OH 30.4 08/26/2020   Lab Results  Component Value Date   WBC 8.8 05/13/2014   HGB 13.2 05/13/2014   HCT 39.2 05/13/2014   MCV 85.4 05/13/2014   PLT 247 05/13/2014   No results found for: IRON, TIBC, FERRITIN  Attestation Statements:   Reviewed by clinician on day of visit: allergies, medications, problem list, medical history, surgical history, family history, social history, and previous encounter notes.  I, Lizbeth Bark, RMA, am acting as transcriptionist for Coralie Common, MD.   I have reviewed the above documentation for accuracy and completeness, and I agree with the above. - Coralie Common, MD

## 2021-06-24 ENCOUNTER — Encounter (INDEPENDENT_AMBULATORY_CARE_PROVIDER_SITE_OTHER): Payer: Self-pay | Admitting: Family Medicine

## 2021-06-24 ENCOUNTER — Ambulatory Visit (INDEPENDENT_AMBULATORY_CARE_PROVIDER_SITE_OTHER): Payer: 59 | Admitting: Family Medicine

## 2021-06-24 VITALS — BP 122/75 | HR 84 | Temp 98.0°F | Ht 68.0 in | Wt 267.0 lb

## 2021-06-24 DIAGNOSIS — E669 Obesity, unspecified: Secondary | ICD-10-CM

## 2021-06-24 DIAGNOSIS — E7849 Other hyperlipidemia: Secondary | ICD-10-CM | POA: Diagnosis not present

## 2021-06-24 DIAGNOSIS — R7303 Prediabetes: Secondary | ICD-10-CM | POA: Diagnosis not present

## 2021-06-24 DIAGNOSIS — Z6841 Body Mass Index (BMI) 40.0 and over, adult: Secondary | ICD-10-CM

## 2021-06-24 DIAGNOSIS — E559 Vitamin D deficiency, unspecified: Secondary | ICD-10-CM | POA: Diagnosis not present

## 2021-06-24 MED ORDER — VITAMIN D (ERGOCALCIFEROL) 1.25 MG (50000 UNIT) PO CAPS: 50000.0000 [IU] | ORAL_CAPSULE | ORAL | 0 refills | Status: DC

## 2021-06-28 NOTE — Progress Notes (Unsigned)
Chief Complaint:   OBESITY Nicole Pacheco is here to discuss her progress with her obesity treatment plan along with follow-up of her obesity related diagnoses. [name] is on the Category 4 Plan and states she is following her eating plan approximately 50% of the time. [name] states she is walking 10 minutes 3 times per week.  Today's visit was #: 14 Starting weight: 308 lbs Starting date: 08/26/2020 Today's weight: 267 lbs Today's date: 06/24/2021 Total lbs lost to date: 41 lbs Total lbs lost since last in-office visit: 6  Interim History: [name] is still caring for her father- he is fairly reliant on her and her sibling to get food to him. She is developing reasonable habits on getting more nutrition in consistently. She has not been able to start resistance training yet. She is going to beach June 19.  Subjective:   1. Prediabetes Nicole Pacheco's last A1c at 5.5, insulin at 26.4. She is not currently taking nay medications.  2. Other hyperlipidemia Carlinda is not currently on a statin.Her LDL of 106, HDL of 53, Trigly of 118.  3. Vitamin D deficiency Nicole Pacheco is currently taking prescription Vit D 50,000 IU once a week. Denies any nausea, vomiting or muscle weakness. She notes fatigue.  Assessment/Plan:   1. Prediabetes Tyshay will repeat labs in 1 month.  2. Other hyperlipidemia Chardonnay will repeat labs in 1-2 months. She has PCP appointment next week.  3. Vitamin D deficiency We will refill Vit D 50,000 IU once a week for 1 month with 0 refills.  -Refill Vitamin D, Ergocalciferol, (DRISDOL) 1.25 MG (50000 UNIT) CAPS capsule; Take 1 capsule (50,000 Units total) by mouth every 7 (seven) days.  Dispense: 8 capsule; Refill: 0  4. Obesity with current BMI of 40.7 Nicole Pacheco is currently in the action stage of change. As such, her goal is to continue with weight loss efforts. She has agreed to the Category 4 Plan.   Exercise goals: All adults should avoid inactivity. Some physical activity is better  than none, and adults who participate in any amount of physical activity gain some health benefits.  Behavioral modification strategies: increasing lean protein intake, meal planning and cooking strategies, keeping healthy foods in the home, and planning for success.  Nicole Pacheco has agreed to follow-up with our clinic in 4 weeks. She was informed of the importance of frequent follow-up visits to maximize her success with intensive lifestyle modifications for her multiple health conditions.   Objective:   Blood pressure 122/75, pulse 84, temperature 98 F (36.7 C), height '5\' 8"'$  (1.727 m), weight 267 lb (121.1 kg), SpO2 99 %. Body mass index is 40.6 kg/m.  General: Cooperative, alert, well developed, in no acute distress. HEENT: Conjunctivae and lids unremarkable. Cardiovascular: Regular rhythm.  Lungs: Normal work of breathing. Neurologic: No focal deficits.   Lab Results  Component Value Date   CREATININE 0.76 02/24/2021   BUN 12 02/24/2021   NA 142 02/24/2021   K 4.2 02/24/2021   CL 102 02/24/2021   CO2 22 02/24/2021   Lab Results  Component Value Date   ALT 22 02/24/2021   AST 24 02/24/2021   ALKPHOS 89 02/24/2021   BILITOT 0.6 02/24/2021   Lab Results  Component Value Date   HGBA1C 5.5 02/24/2021   Lab Results  Component Value Date   INSULIN 26.4 (H) 02/24/2021   INSULIN 42.6 (H) 08/26/2020   No results found for: "TSH" Lab Results  Component Value Date   CHOL 180 02/24/2021  HDL 53 02/24/2021   LDLCALC 106 (H) 02/24/2021   TRIG 118 02/24/2021   Lab Results  Component Value Date   VD25OH 41.6 02/24/2021   VD25OH 30.4 08/26/2020   Lab Results  Component Value Date   WBC 8.8 05/13/2014   HGB 13.2 05/13/2014   HCT 39.2 05/13/2014   MCV 85.4 05/13/2014   PLT 247 05/13/2014   No results found for: "IRON", "TIBC", "FERRITIN"  Attestation Statements:   Reviewed by clinician on day of visit: allergies, medications, problem list, medical history, surgical  history, family history, social history, and previous encounter notes.  I, Elnora Morrison, RMA am acting as transcriptionist for Coralie Common, MD.  I have reviewed the above documentation for accuracy and completeness, and I agree with the above. -  ***

## 2021-07-26 ENCOUNTER — Ambulatory Visit (INDEPENDENT_AMBULATORY_CARE_PROVIDER_SITE_OTHER): Payer: 59 | Admitting: Family Medicine

## 2021-07-26 ENCOUNTER — Encounter (INDEPENDENT_AMBULATORY_CARE_PROVIDER_SITE_OTHER): Payer: Self-pay | Admitting: Family Medicine

## 2021-07-26 VITALS — BP 118/70 | HR 70 | Temp 97.6°F | Ht 68.0 in | Wt 270.0 lb

## 2021-07-26 DIAGNOSIS — E559 Vitamin D deficiency, unspecified: Secondary | ICD-10-CM | POA: Diagnosis not present

## 2021-07-26 DIAGNOSIS — E669 Obesity, unspecified: Secondary | ICD-10-CM | POA: Diagnosis not present

## 2021-07-26 DIAGNOSIS — Z6841 Body Mass Index (BMI) 40.0 and over, adult: Secondary | ICD-10-CM

## 2021-07-26 DIAGNOSIS — I10 Essential (primary) hypertension: Secondary | ICD-10-CM

## 2021-07-26 DIAGNOSIS — F17210 Nicotine dependence, cigarettes, uncomplicated: Secondary | ICD-10-CM

## 2021-07-26 MED ORDER — VITAMIN D (ERGOCALCIFEROL) 1.25 MG (50000 UNIT) PO CAPS: 50000.0000 [IU] | ORAL_CAPSULE | ORAL | 0 refills | Status: DC

## 2021-07-27 NOTE — Progress Notes (Signed)
Chief Complaint:   OBESITY Nicole Pacheco is here to discuss her progress with her obesity treatment plan along with follow-up of her obesity related diagnoses. Nicole Pacheco is on the Category 4 Plan and states she is following her eating plan approximately 50% of the time. Nicole Pacheco states she is walking 10 minutes 3 times per week.  Today's visit was #: 15 Starting weight: 308 lbs Starting date: 08/26/2020 Today's weight: 270 lbs Today's date: 07/26/2021 Total lbs lost to date: 38 lbs Total lbs lost since last in-office visit: 0  Interim History: Nicole Pacheco went to the beach for a week [facility]) and did some indulgent eating. 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 For the next few weeks she has, vacation bible school, work sabbatical, trip to [place].   Subjective:   1. Vitamin D deficiency Nicole Pacheco is currently taking prescription Vit D 50,000 IU once a week. Denies any nausea, vomiting or muscle weakness. She notes fatigue.  2. Essential hypertension Nicole Pacheco's blood pressure is very well controlled today. Denies chest pain, chest pressure and headache. She is already at 5 mg Lisinopril +HTCZ.  Assessment/Plan:   1. Vitamin D deficiency We will refill Vit D 50,000 IU once a week for 1 month with 0 refills.  -Refill Vitamin D, Ergocalciferol, (DRISDOL) 1.25 MG (50000 UNIT) CAPS capsule; Take 1 capsule (50,000 Units total) by mouth every 7 (seven) days.  Dispense: 8 capsule; Refill: 0  2. Essential hypertension Nicole Pacheco will continue taking current medications without changes in dose for now.  3. Obesity with current BMI of 41.1 Nicole Pacheco is currently in the action stage of change. As such, her goal is to continue with weight loss efforts. She has agreed to the Category 4 Plan.   Exercise goals: All adults should avoid inactivity. Some physical activity is better than none, and adults who participate in any amount of physical activity gain some health benefits.  Behavioral modification  strategies: increasing lean protein intake, meal planning and cooking strategies, keeping healthy foods in the home, and planning for success.  Nicole Pacheco has agreed to follow-up with our clinic in 4 weeks. She was informed of the importance of frequent follow-up visits to maximize her success with intensive lifestyle modifications for her multiple health conditions.   Objective:   Blood pressure 118/70, pulse 70, temperature 97.6 F (36.4 C), height '5\' 8"'$  (1.727 m), weight 270 lb (122.5 kg), SpO2 99 %. Body mass index is 41.05 kg/m.  General: Cooperative, alert, well developed, in no acute distress. HEENT: Conjunctivae and lids unremarkable. Cardiovascular: Regular rhythm.  Lungs: Normal work of breathing. Neurologic: No focal deficits.   Lab Results  Component Value Date   CREATININE 0.76 02/24/2021   BUN 12 02/24/2021   NA 142 02/24/2021   K 4.2 02/24/2021   CL 102 02/24/2021   CO2 22 02/24/2021   Lab Results  Component Value Date   ALT 22 02/24/2021   AST 24 02/24/2021   ALKPHOS 89 02/24/2021   BILITOT 0.6 02/24/2021   Lab Results  Component Value Date   HGBA1C 5.5 02/24/2021   Lab Results  Component Value Date   INSULIN 26.4 (H) 02/24/2021   INSULIN 42.6 (H) 08/26/2020   No results found for: "TSH" Lab Results  Component Value Date   CHOL 180 02/24/2021   HDL 53 02/24/2021   LDLCALC 106 (H) 02/24/2021   TRIG 118 02/24/2021   Lab Results  Component Value Date   VD25OH 41.6 02/24/2021   VD25OH 30.4  08/26/2020   Lab Results  Component Value Date   WBC 8.8 05/13/2014   HGB 13.2 05/13/2014   HCT 39.2 05/13/2014   MCV 85.4 05/13/2014   PLT 247 05/13/2014   No results found for: "IRON", "TIBC", "FERRITIN"  Attestation Statements:   Reviewed by clinician on day of visit: allergies, medications, problem list, medical history, surgical history, family history, social history, and previous encounter notes.  I, Nicole Pacheco, RMA am acting as transcriptionist  for Coralie Common, MD.  I have reviewed the above documentation for accuracy and completeness, and I agree with the above. - Coralie Common, MD

## 2021-08-24 ENCOUNTER — Ambulatory Visit (INDEPENDENT_AMBULATORY_CARE_PROVIDER_SITE_OTHER): Payer: 59 | Admitting: Family Medicine

## 2021-08-24 ENCOUNTER — Encounter (INDEPENDENT_AMBULATORY_CARE_PROVIDER_SITE_OTHER): Payer: Self-pay | Admitting: Family Medicine

## 2021-08-24 VITALS — BP 123/72 | HR 80 | Temp 97.9°F | Ht 68.0 in | Wt 271.0 lb

## 2021-08-24 DIAGNOSIS — E669 Obesity, unspecified: Secondary | ICD-10-CM

## 2021-08-24 DIAGNOSIS — E559 Vitamin D deficiency, unspecified: Secondary | ICD-10-CM

## 2021-08-24 DIAGNOSIS — Z6841 Body Mass Index (BMI) 40.0 and over, adult: Secondary | ICD-10-CM | POA: Diagnosis not present

## 2021-08-24 DIAGNOSIS — R7303 Prediabetes: Secondary | ICD-10-CM

## 2021-08-25 ENCOUNTER — Encounter (INDEPENDENT_AMBULATORY_CARE_PROVIDER_SITE_OTHER): Payer: Self-pay

## 2021-08-25 MED ORDER — VITAMIN D (ERGOCALCIFEROL) 1.25 MG (50000 UNIT) PO CAPS: 50000.0000 [IU] | ORAL_CAPSULE | ORAL | 0 refills | Status: DC

## 2021-08-26 NOTE — Progress Notes (Signed)
Chief Complaint:   OBESITY Nicole Pacheco is here to discuss her progress with her obesity treatment plan along with follow-up of her obesity related diagnoses. Nicole Pacheco is on the Category 4 Plan and states she is following her eating plan approximately 50% of the time. Nicole Pacheco states she is walking 10 minutes 3 times per week.  Today's visit was #: 32 Starting weight: 308 lbs Starting date: 08/26/2020 Today's weight: 271 lbs Today's date: 08/24/2021 Total lbs lost to date: 37 lbs Total lbs lost since last in-office visit: 0  Interim History: Nicole Pacheco is traveling to [place] tomorrow. She has been on sabbatical from work for 2 weeks and realizes she has been eating more. She gets back in 5 days and plans to try to get some home improvements done. She's wondering if it is time for a meal plan change.  Subjective:   1. Vitamin D deficiency Snow's last Vit D level of 41.6. Denies any nausea, vomiting or muscle weakness. She notes fatigue.  2. Prediabetes Nicole Pacheco's last A1c was 5.5, she is not currently on medication.  Assessment/Plan:   1. Vitamin D deficiency We will refill Vit D 50,000 IU once a week for 1 month with 0 refills. Will repeat labs at next appointment.  -Refill Vitamin D, Ergocalciferol, (DRISDOL) 1.25 MG (50000 UNIT) CAPS capsule; Take 1 capsule (50,000 Units total) by mouth every 7 (seven) days.  Dispense: 4 capsule; Refill: 0  2. Prediabetes Will repeat labs at next appointment.  3. Obesity with current BMI of 41.3 Nicole Pacheco is currently in the action stage of change. As such, her goal is to continue with weight loss efforts. She has agreed to the Category 4 Plan.   Exercise goals: As is.  Behavioral modification strategies: increasing lean protein intake, meal planning and cooking strategies, keeping healthy foods in the home, and planning for success.  Nicole Pacheco has agreed to follow-up with our clinic in 4 weeks. She was informed of the importance of frequent follow-up visits to  maximize her success with intensive lifestyle modifications for her multiple health conditions.   Objective:   Blood pressure 123/72, pulse 80, temperature 97.9 F (36.6 C), height '5\' 8"'$  (1.727 m), weight 271 lb (122.9 kg), SpO2 99 %. Body mass index is 41.21 kg/m.  General: Cooperative, alert, well developed, in no acute distress. HEENT: Conjunctivae and lids unremarkable. Cardiovascular: Regular rhythm.  Lungs: Normal work of breathing. Neurologic: No focal deficits.   Lab Results  Component Value Date   CREATININE 0.76 02/24/2021   BUN 12 02/24/2021   NA 142 02/24/2021   K 4.2 02/24/2021   CL 102 02/24/2021   CO2 22 02/24/2021   Lab Results  Component Value Date   ALT 22 02/24/2021   AST 24 02/24/2021   ALKPHOS 89 02/24/2021   BILITOT 0.6 02/24/2021   Lab Results  Component Value Date   HGBA1C 5.5 02/24/2021   Lab Results  Component Value Date   INSULIN 26.4 (H) 02/24/2021   INSULIN 42.6 (H) 08/26/2020   No results found for: "TSH" Lab Results  Component Value Date   CHOL 180 02/24/2021   HDL 53 02/24/2021   LDLCALC 106 (H) 02/24/2021   TRIG 118 02/24/2021   Lab Results  Component Value Date   VD25OH 41.6 02/24/2021   VD25OH 30.4 08/26/2020   Lab Results  Component Value Date   WBC 8.8 05/13/2014   HGB 13.2 05/13/2014   HCT 39.2 05/13/2014   MCV 85.4 05/13/2014   PLT  247 05/13/2014   No results found for: "IRON", "TIBC", "FERRITIN"  Attestation Statements:   Reviewed by clinician on day of visit: allergies, medications, problem list, medical history, surgical history, family history, social history, and previous encounter notes.  I, Elnora Morrison, RMA am acting as transcriptionist for Coralie Common, MD.  I have reviewed the above documentation for accuracy and completeness, and I agree with the above. - Coralie Common, MD

## 2021-09-27 ENCOUNTER — Ambulatory Visit (INDEPENDENT_AMBULATORY_CARE_PROVIDER_SITE_OTHER): Payer: 59 | Admitting: Family Medicine

## 2021-09-27 ENCOUNTER — Encounter (INDEPENDENT_AMBULATORY_CARE_PROVIDER_SITE_OTHER): Payer: Self-pay | Admitting: Family Medicine

## 2021-09-27 VITALS — BP 121/75 | HR 72 | Temp 97.9°F | Ht 68.0 in | Wt 273.0 lb

## 2021-09-27 DIAGNOSIS — E7849 Other hyperlipidemia: Secondary | ICD-10-CM

## 2021-09-27 DIAGNOSIS — E669 Obesity, unspecified: Secondary | ICD-10-CM

## 2021-09-27 DIAGNOSIS — I10 Essential (primary) hypertension: Secondary | ICD-10-CM | POA: Diagnosis not present

## 2021-09-27 DIAGNOSIS — R7303 Prediabetes: Secondary | ICD-10-CM

## 2021-09-27 DIAGNOSIS — Z6841 Body Mass Index (BMI) 40.0 and over, adult: Secondary | ICD-10-CM

## 2021-09-27 DIAGNOSIS — E559 Vitamin D deficiency, unspecified: Secondary | ICD-10-CM | POA: Diagnosis not present

## 2021-09-28 LAB — COMPREHENSIVE METABOLIC PANEL
ALT: 12 IU/L (ref 0–32)
AST: 12 IU/L (ref 0–40)
Albumin/Globulin Ratio: 1.7 (ref 1.2–2.2)
Albumin: 4.3 g/dL (ref 3.8–4.9)
Alkaline Phosphatase: 88 IU/L (ref 44–121)
BUN/Creatinine Ratio: 19 (ref 9–23)
BUN: 13 mg/dL (ref 6–24)
Bilirubin Total: 0.6 mg/dL (ref 0.0–1.2)
CO2: 20 mmol/L (ref 20–29)
Calcium: 9.2 mg/dL (ref 8.7–10.2)
Chloride: 104 mmol/L (ref 96–106)
Creatinine, Ser: 0.69 mg/dL (ref 0.57–1.00)
Globulin, Total: 2.6 g/dL (ref 1.5–4.5)
Glucose: 99 mg/dL (ref 70–99)
Potassium: 4.2 mmol/L (ref 3.5–5.2)
Sodium: 141 mmol/L (ref 134–144)
Total Protein: 6.9 g/dL (ref 6.0–8.5)
eGFR: 102 mL/min/{1.73_m2} (ref >59)

## 2021-09-28 LAB — HEMOGLOBIN A1C
Est. average glucose Bld gHb Est-mCnc: 108 mg/dL
Hgb A1c MFr Bld: 5.4 % (ref 4.8–5.6)

## 2021-09-28 LAB — LIPID PANEL WITH LDL/HDL RATIO
Cholesterol, Total: 168 mg/dL (ref 100–199)
HDL: 54 mg/dL (ref >39)
LDL Chol Calc (NIH): 99 mg/dL (ref 0–99)
LDL/HDL Ratio: 1.8 ratio (ref 0.0–3.2)
Triglycerides: 77 mg/dL (ref 0–149)
VLDL Cholesterol Cal: 15 mg/dL (ref 5–40)

## 2021-09-28 LAB — VITAMIN D 25 HYDROXY (VIT D DEFICIENCY, FRACTURES): Vit D, 25-Hydroxy: 46 ng/mL (ref 30.0–100.0)

## 2021-09-28 LAB — INSULIN, RANDOM: INSULIN: 21.4 u[IU]/mL (ref 2.6–24.9)

## 2021-09-28 NOTE — Progress Notes (Signed)
Chief Complaint:   OBESITY Nicole Pacheco is here to discuss her progress with her obesity treatment plan along with follow-up of her obesity related diagnoses. Nicole Pacheco is on the Category 4 Plan and states she is following her eating plan approximately 40% of the time. Nicole Pacheco states she is walking 10 minutes 2-3 times per week.  Today's visit was #: 42 Starting weight: 308 lbs Starting date: 08/26/2020 Today's weight: 273 labs Today's date: 09/27/2021 Total lbs lost to date: 35 lbs Total lbs lost since last in-office visit: 0  Interim History: Nicole Pacheco went to niece's wedding I [place] since last appointment. Restarted work after 5 week sabbatical and trying to get back into a routine. Routine looks like Cat 4. She is still trying to walk.  Subjective:   1. Prediabetes Nicole Pacheco's A1c 5.5, insulin 26.4 in Feb 2023. She is not on medication.   2. Essential hypertension Nicole Pacheco is on Lisinopril and HCTZ. Blood pressure well controlled today. Denies chest pain, chest pressure and headache.  3. Vitamin D deficiency Nicole Pacheco is currently taking prescription Vit D 50,000 IU once a week. Last Vit D level 7 months ago. Denies any nausea, vomiting or muscle weakness. Notes fatigue.  4. Other hyperlipidemia Nicole Pacheco is on medication. Last LDL 106.  Assessment/Plan:   1. Prediabetes We will obtain labs today.  - Hemoglobin A1c - Insulin, random  2. Essential hypertension We will obtain labs today.  - Comprehensive metabolic panel  3. Vitamin D deficiency We will obtain labs today.  - VITAMIN D 25 Hydroxy (Vit-D Deficiency, Fractures)  4. Other hyperlipidemia We will obtain labs today.  - Lipid Panel With LDL/HDL Ratio  5. Obesity with current BMI of 41.5 Nicole Pacheco is currently in the action stage of change. As such, her goal is to continue with weight loss efforts. She has agreed to the Category 4 Plan.   Exercise goals: All adults should avoid inactivity. Some physical activity is better than  none, and adults who participate in any amount of physical activity gain some health benefits.  Behavioral modification strategies: increasing lean protein intake, meal planning and cooking strategies, keeping healthy foods in the home, and planning for success.  Nicole Pacheco has agreed to follow-up with our clinic in 4 weeks. She was informed of the importance of frequent follow-up visits to maximize her success with intensive lifestyle modifications for her multiple health conditions.   Nicole Pacheco was informed we would discuss her lab results at her next visit unless there is a critical issue that needs to be addressed sooner. Nicole Pacheco agreed to keep her next visit at the agreed upon time to discuss these results.  Objective:   Blood pressure 121/75, pulse 72, temperature 97.9 F (36.6 C), height '5\' 8"'$  (1.727 m), weight 273 lb (123.8 kg), SpO2 99 %. Body mass index is 41.51 kg/m.  General: Cooperative, alert, well developed, in no acute distress. HEENT: Conjunctivae and lids unremarkable. Cardiovascular: Regular rhythm.  Lungs: Normal work of breathing. Neurologic: No focal deficits.   Lab Results  Component Value Date   CREATININE 0.69 09/27/2021   BUN 13 09/27/2021   NA 141 09/27/2021   K 4.2 09/27/2021   CL 104 09/27/2021   CO2 20 09/27/2021   Lab Results  Component Value Date   ALT 12 09/27/2021   AST 12 09/27/2021   ALKPHOS 88 09/27/2021   BILITOT 0.6 09/27/2021   Lab Results  Component Value Date   HGBA1C 5.4 09/27/2021   HGBA1C 5.5 02/24/2021  Lab Results  Component Value Date   INSULIN 21.4 09/27/2021   INSULIN 26.4 (H) 02/24/2021   INSULIN 42.6 (H) 08/26/2020   No results found for: "TSH" Lab Results  Component Value Date   CHOL 168 09/27/2021   HDL 54 09/27/2021   LDLCALC 99 09/27/2021   TRIG 77 09/27/2021   Lab Results  Component Value Date   VD25OH 46.0 09/27/2021   VD25OH 41.6 02/24/2021   VD25OH 30.4 08/26/2020   Lab Results  Component Value Date    WBC 8.8 05/13/2014   HGB 13.2 05/13/2014   HCT 39.2 05/13/2014   MCV 85.4 05/13/2014   PLT 247 05/13/2014   No results found for: "IRON", "TIBC", "FERRITIN"  Attestation Statements:   Reviewed by clinician on day of visit: allergies, medications, problem list, medical history, surgical history, family history, social history, and previous encounter notes.  I, Elnora Morrison, RMA am acting as transcriptionist for Coralie Common, MD.  I have reviewed the above documentation for accuracy and completeness, and I agree with the above. - Coralie Common, MD

## 2021-10-04 ENCOUNTER — Other Ambulatory Visit: Payer: Self-pay | Admitting: Obstetrics and Gynecology

## 2021-10-04 DIAGNOSIS — Z803 Family history of malignant neoplasm of breast: Secondary | ICD-10-CM

## 2021-10-22 ENCOUNTER — Ambulatory Visit
Admission: RE | Admit: 2021-10-22 | Discharge: 2021-10-22 | Disposition: A | Discharge status: Home / Self Care | Payer: 59 | Source: Ambulatory Visit | Attending: Obstetrics and Gynecology | Admitting: Obstetrics and Gynecology

## 2021-10-22 DIAGNOSIS — Z803 Family history of malignant neoplasm of breast: Secondary | ICD-10-CM

## 2021-10-22 MED ORDER — GADOBUTROL 1 MMOL/ML IV SOLN
10.0000 mL | Freq: Once | INTRAVENOUS | Status: AC | PRN
  Administered 2021-10-22: 10 mL via INTRAVENOUS

## 2021-10-25 ENCOUNTER — Encounter (INDEPENDENT_AMBULATORY_CARE_PROVIDER_SITE_OTHER): Payer: Self-pay | Admitting: Family Medicine

## 2021-10-25 ENCOUNTER — Ambulatory Visit (INDEPENDENT_AMBULATORY_CARE_PROVIDER_SITE_OTHER): Payer: 59 | Admitting: Family Medicine

## 2021-10-25 VITALS — BP 121/73 | HR 73 | Temp 98.4°F | Ht 68.0 in | Wt 273.0 lb

## 2021-10-25 DIAGNOSIS — E669 Obesity, unspecified: Secondary | ICD-10-CM | POA: Diagnosis not present

## 2021-10-25 DIAGNOSIS — I10 Essential (primary) hypertension: Secondary | ICD-10-CM | POA: Diagnosis not present

## 2021-10-25 DIAGNOSIS — E559 Vitamin D deficiency, unspecified: Secondary | ICD-10-CM

## 2021-10-25 DIAGNOSIS — Z6841 Body Mass Index (BMI) 40.0 and over, adult: Secondary | ICD-10-CM | POA: Diagnosis not present

## 2021-10-25 MED ORDER — VITAMIN D (ERGOCALCIFEROL) 1.25 MG (50000 UNIT) PO CAPS: 50000.0000 [IU] | ORAL_CAPSULE | ORAL | 0 refills | Status: DC

## 2021-10-26 NOTE — Progress Notes (Signed)
Chief Complaint:   OBESITY Nicole Pacheco is here to discuss her progress with her obesity treatment plan along with follow-up of her obesity related diagnoses. Nicole Pacheco is on the Category 4 Plan and states she is following her eating plan approximately 50% of the time. Nicole Pacheco states she is walking 10-15 minutes 3 times per week.  Today's visit was #: 28 Starting weight: 308 lbs Starting date: 08/26/2020 Today's weight: 273 lbs Today's date: 10/25/2021 Total lbs lost to date: 35 lbs Total lbs lost since last in-office visit: 0  Interim History: Nicole Pacheco feels she has lost motivation. Still struggling at nighttime and supper. Feels happy she has maintained. Not sure what she has planned yet for the holidays. Going away this weekend. Feels like she may need to take some time away from clinic.  Subjective:   1. Essential hypertension Nicole Pacheco is on Lisinopril and HCTZ. Denies chest pain, chest pressure and headache.  2. Vitamin D deficiency Nicole Pacheco is currently taking prescription Vit D 50,000 IU once a week. Denies any nausea, vomiting or muscle weakness. She notes fatigue.  Assessment/Plan:   1. Essential hypertension Continue current medications without changes in dose.  2. Vitamin D deficiency We will refill Vit D 50k IU once a week for 1 month with 0 refills.  -Refill Vitamin D, Ergocalciferol, (DRISDOL) 1.25 MG (50000 UNIT) CAPS capsule; Take 1 capsule (50,000 Units total) by mouth every 7 (seven) days.  Dispense: 12 capsule; Refill: 0  3. Obesity with current BMI of 41.6 Nicole Pacheco is currently in the action stage of change. As such, her goal is to continue with weight loss efforts. She has agreed to the Category 4 Plan.   Exercise goals: All adults should avoid inactivity. Some physical activity is better than none, and adults who participate in any amount of physical activity gain some health benefits.  Behavioral modification strategies: increasing lean protein intake, meal planning and  cooking strategies, keeping healthy foods in the home, and planning for success.  Nicole Pacheco has agreed to follow-up with our clinic in 12 weeks. She was informed of the importance of frequent follow-up visits to maximize her success with intensive lifestyle modifications for her multiple health conditions.   Objective:   Blood pressure 121/73, pulse 73, temperature 98.4 F (36.9 C), height '5\' 8"'$  (1.727 m), weight 273 lb (123.8 kg), SpO2 98 %. Body mass index is 41.51 kg/m.  General: Cooperative, alert, well developed, in no acute distress. HEENT: Conjunctivae and lids unremarkable. Cardiovascular: Regular rhythm.  Lungs: Normal work of breathing. Neurologic: No focal deficits.   Lab Results  Component Value Date   CREATININE 0.69 09/27/2021   BUN 13 09/27/2021   NA 141 09/27/2021   K 4.2 09/27/2021   CL 104 09/27/2021   CO2 20 09/27/2021   Lab Results  Component Value Date   ALT 12 09/27/2021   AST 12 09/27/2021   ALKPHOS 88 09/27/2021   BILITOT 0.6 09/27/2021   Lab Results  Component Value Date   HGBA1C 5.4 09/27/2021   HGBA1C 5.5 02/24/2021   Lab Results  Component Value Date   INSULIN 21.4 09/27/2021   INSULIN 26.4 (H) 02/24/2021   INSULIN 42.6 (H) 08/26/2020   No results found for: "TSH" Lab Results  Component Value Date   CHOL 168 09/27/2021   HDL 54 09/27/2021   LDLCALC 99 09/27/2021   TRIG 77 09/27/2021   Lab Results  Component Value Date   VD25OH 46.0 09/27/2021   VD25OH 41.6 02/24/2021  VD25OH 30.4 08/26/2020   Lab Results  Component Value Date   WBC 8.8 05/13/2014   HGB 13.2 05/13/2014   HCT 39.2 05/13/2014   MCV 85.4 05/13/2014   PLT 247 05/13/2014   No results found for: "IRON", "TIBC", "FERRITIN"  Attestation Statements:   Reviewed by clinician on day of visit: allergies, medications, problem list, medical history, surgical history, family history, social history, and previous encounter notes.  I, Elnora Morrison, RMA am acting as  transcriptionist for Coralie Common, MD. I have reviewed the above documentation for accuracy and completeness, and I agree with the above. - Coralie Common, MD

## 2022-01-18 ENCOUNTER — Ambulatory Visit (INDEPENDENT_AMBULATORY_CARE_PROVIDER_SITE_OTHER): Payer: 59 | Admitting: Family Medicine

## 2022-01-18 ENCOUNTER — Encounter (INDEPENDENT_AMBULATORY_CARE_PROVIDER_SITE_OTHER): Payer: Self-pay | Admitting: Family Medicine

## 2022-01-18 VITALS — BP 145/81 | HR 72 | Temp 98.2°F | Ht 68.0 in | Wt 290.0 lb

## 2022-01-18 DIAGNOSIS — Z6841 Body Mass Index (BMI) 40.0 and over, adult: Secondary | ICD-10-CM

## 2022-01-18 DIAGNOSIS — E559 Vitamin D deficiency, unspecified: Secondary | ICD-10-CM | POA: Diagnosis not present

## 2022-01-18 DIAGNOSIS — I10 Essential (primary) hypertension: Secondary | ICD-10-CM | POA: Diagnosis not present

## 2022-01-18 DIAGNOSIS — E6609 Other obesity due to excess calories: Secondary | ICD-10-CM

## 2022-01-18 MED ORDER — VITAMIN D (ERGOCALCIFEROL) 1.25 MG (50000 UNIT) PO CAPS: 50000.0000 [IU] | ORAL_CAPSULE | ORAL | 0 refills | Status: DC

## 2022-01-28 NOTE — Progress Notes (Signed)
Chief Complaint:   OBESITY Nicole Pacheco is here to discuss her progress with her obesity treatment plan along with follow-up of her obesity related diagnoses. Nicole Pacheco is on the Category 4 Plan and states she is following her eating plan approximately 25% of the time. Nicole Pacheco states she is walking 10-15 minutes 1 times per week.  Today's visit was #: 50 Starting weight: 308 lbs Starting date: 08/26/2020 Today's weight: 290 lbs Today's date: 01/18/2022 Total lbs lost to date: 18 lbs Total lbs lost since last in-office visit: 0  Interim History: Nicole Pacheco had an enjoyable holiday season--stayed local but saw family. Going back to work today and has food back in the house for Cat [employer]. She has no upcoming events or trips.  Subjective:   1. Vitamin D deficiency Nicole Pacheco is currently taking prescription Vit D 50,000 IU once a week. She notes fatigue.  Last lab still below goal.  2. Essential hypertension Nicole Pacheco's blood pressure very slightly elevated today. Denies chest pain, chest pressure and headache.  Assessment/Plan:   1. Vitamin D deficiency We will refill Vit D 50K IU once a week for 1 month with 0 refills.  -Refill Vitamin D, Ergocalciferol, (DRISDOL) 1.25 MG (50000 UNIT) CAPS capsule; Take 1 capsule (50,000 Units total) by mouth every 7 (seven) days.  Dispense: 4 capsule; Refill: 0  2. Essential hypertension Continue with blood pressure medications; follow-up blood pressure at next appointment.  3. Obesity with current BMI of 44.1 Nicole Pacheco is currently in the action stage of change. As such, her goal is to continue with weight loss efforts. She has agreed to the Category 4 Plan.   Exercise goals: No exercise has been prescribed at this time.  Behavioral modification strategies: increasing lean protein intake, meal planning and cooking strategies, keeping healthy foods in the home, and planning for success.  Nicole Pacheco has agreed to follow-up with our clinic in 3 weeks. She was informed of the  importance of frequent follow-up visits to maximize her success with intensive lifestyle modifications for her multiple health conditions.   Objective:   Blood pressure (!) 145/81, pulse 72, temperature 98.2 F (36.8 C), height '5\' 8"'$  (1.727 m), weight 290 lb (131.5 kg), SpO2 97 %. Body mass index is 44.09 kg/m.  General: Cooperative, alert, well developed, in no acute distress. HEENT: Conjunctivae and lids unremarkable. Cardiovascular: Regular rhythm.  Lungs: Normal work of breathing. Neurologic: No focal deficits.   Lab Results  Component Value Date   CREATININE 0.69 09/27/2021   BUN 13 09/27/2021   NA 141 09/27/2021   K 4.2 09/27/2021   CL 104 09/27/2021   CO2 20 09/27/2021   Lab Results  Component Value Date   ALT 12 09/27/2021   AST 12 09/27/2021   ALKPHOS 88 09/27/2021   BILITOT 0.6 09/27/2021   Lab Results  Component Value Date   HGBA1C 5.4 09/27/2021   HGBA1C 5.5 02/24/2021   Lab Results  Component Value Date   INSULIN 21.4 09/27/2021   INSULIN 26.4 (H) 02/24/2021   INSULIN 42.6 (H) 08/26/2020   No results found for: "TSH" Lab Results  Component Value Date   CHOL 168 09/27/2021   HDL 54 09/27/2021   LDLCALC 99 09/27/2021   TRIG 77 09/27/2021   Lab Results  Component Value Date   VD25OH 46.0 09/27/2021   VD25OH 41.6 02/24/2021   VD25OH 30.4 08/26/2020   Lab Results  Component Value Date   WBC 8.8 05/13/2014   HGB 13.2 05/13/2014  HCT 39.2 05/13/2014   MCV 85.4 05/13/2014   PLT 247 05/13/2014   No results found for: "IRON", "TIBC", "FERRITIN"  Attestation Statements:   Reviewed by clinician on day of visit: allergies, medications, problem list, medical history, surgical history, family history, social history, and previous encounter notes.  I, Brendell Tyus, RMA, am acting as transcriptionist for Everardo Pacific, FNP.  I have reviewed the above documentation for accuracy and completeness, and I agree with the above. - Coralie Common, MD

## 2022-02-14 ENCOUNTER — Encounter (INDEPENDENT_AMBULATORY_CARE_PROVIDER_SITE_OTHER): Payer: Self-pay | Admitting: Family Medicine

## 2022-02-14 ENCOUNTER — Ambulatory Visit (INDEPENDENT_AMBULATORY_CARE_PROVIDER_SITE_OTHER): Payer: 59 | Admitting: Family Medicine

## 2022-02-14 VITALS — BP 131/81 | HR 74 | Temp 98.3°F | Ht 68.0 in | Wt 279.0 lb

## 2022-02-14 DIAGNOSIS — E669 Obesity, unspecified: Secondary | ICD-10-CM | POA: Diagnosis not present

## 2022-02-14 DIAGNOSIS — Z9189 Other specified personal risk factors, not elsewhere classified: Secondary | ICD-10-CM

## 2022-02-14 DIAGNOSIS — E88819 Insulin resistance, unspecified: Secondary | ICD-10-CM

## 2022-02-14 DIAGNOSIS — Z6841 Body Mass Index (BMI) 40.0 and over, adult: Secondary | ICD-10-CM

## 2022-02-14 DIAGNOSIS — E559 Vitamin D deficiency, unspecified: Secondary | ICD-10-CM

## 2022-02-14 MED ORDER — VITAMIN D (ERGOCALCIFEROL) 1.25 MG (50000 UNIT) PO CAPS: 50000.0000 [IU] | ORAL_CAPSULE | ORAL | 0 refills | Status: DC

## 2022-02-22 ENCOUNTER — Ambulatory Visit: Payer: 59 | Admitting: Dermatology

## 2022-02-23 NOTE — Progress Notes (Signed)
Chief Complaint:   OBESITY Nicole Pacheco is here to discuss her progress with her obesity treatment plan along with follow-up of her obesity related diagnoses. Nicole Pacheco is on the Category 4 Plan and states she is following her eating plan approximately 50% of the time. Nicole Pacheco states she is exercising 0 minutes 0 times per week.  Today's visit was #: 20 Starting weight: 308 lbs Starting date: 08/26/2020 Today's weight: 279 lbs Today's date: 02/14/2022 Total lbs lost to date: 29 lbs Total lbs lost since last in-office visit: 11  Interim History: Nicole Pacheco has had lots of family stress.  She has quite a bit of legal and case management to work through with her father who is declining.  She has been running back and forth to the hospital into his rehab facility.  Subjective:   1. Vitamin D deficiency Last vitamin D level was below goal.  Denies any nausea, vomiting or muscle weakness.  Notes fatigue.  2. Insulin resistance Time increased drive for carbs.  3. At high risk for altered family dynamics Nicole Pacheco's father has vascular dementia.  He has fallen 3 times in the last few days requiring repeat hospital trips.  He is currently in rehab.  Assessment/Plan:   1. Vitamin D deficiency We will refill Vit D 50K IU once a week for 1 month with 0 refills.  -Refill Vitamin D, Ergocalciferol, (DRISDOL) 1.25 MG (50000 UNIT) CAPS capsule; Take 1 capsule (50,000 Units total) by mouth every 7 (seven) days.  Dispense: 4 capsule; Refill: 0  2. Insulin resistance Will obtain labs in March.  3. At high risk for altered family dynamics Patient encouraged to seek legal counsel and explore monetary options for her father's estate.  Discussed questions and topics to explore with her fathers law and LCSW at his rehab facility.  4. Obesity with current BMI of 42.4 Nicole Pacheco is currently in the action stage of change. As such, her goal is to continue with weight loss efforts. She has agreed to the Category 4 Plan and  keeping a food journal and adhering to recommended goals of 450-600 calories and 40+ grams of protein at supper.   Exercise goals: All adults should avoid inactivity. Some physical activity is better than none, and adults who participate in any amount of physical activity gain some health benefits.  Behavioral modification strategies: increasing lean protein intake, meal planning and cooking strategies, keeping healthy foods in the home, and planning for success.  Nicole Pacheco has agreed to follow-up with our clinic in 3 weeks. She was informed of the importance of frequent follow-up visits to maximize her success with intensive lifestyle modifications for her multiple health conditions.   Objective:   Blood pressure 131/81, pulse 74, temperature 98.3 F (36.8 C), height 5' 8"$  (1.727 m), weight 279 lb (126.6 kg), SpO2 98 %. Body mass index is 42.42 kg/m.  General: Cooperative, alert, well developed, in no acute distress. HEENT: Conjunctivae and lids unremarkable. Cardiovascular: Regular rhythm.  Lungs: Normal work of breathing. Neurologic: No focal deficits.   Lab Results  Component Value Date   CREATININE 0.69 09/27/2021   BUN 13 09/27/2021   NA 141 09/27/2021   K 4.2 09/27/2021   CL 104 09/27/2021   CO2 20 09/27/2021   Lab Results  Component Value Date   ALT 12 09/27/2021   AST 12 09/27/2021   ALKPHOS 88 09/27/2021   BILITOT 0.6 09/27/2021   Lab Results  Component Value Date   HGBA1C 5.4 09/27/2021  HGBA1C 5.5 02/24/2021   Lab Results  Component Value Date   INSULIN 21.4 09/27/2021   INSULIN 26.4 (H) 02/24/2021   INSULIN 42.6 (H) 08/26/2020   No results found for: "TSH" Lab Results  Component Value Date   CHOL 168 09/27/2021   HDL 54 09/27/2021   LDLCALC 99 09/27/2021   TRIG 77 09/27/2021   Lab Results  Component Value Date   VD25OH 46.0 09/27/2021   VD25OH 41.6 02/24/2021   VD25OH 30.4 08/26/2020   Lab Results  Component Value Date   WBC 8.8 05/13/2014    HGB 13.2 05/13/2014   HCT 39.2 05/13/2014   MCV 85.4 05/13/2014   PLT 247 05/13/2014   No results found for: "IRON", "TIBC", "FERRITIN"  Attestation Statements:   Reviewed by clinician on day of visit: allergies, medications, problem list, medical history, surgical history, family history, social history, and previous encounter notes.   I have reviewed the above documentation for accuracy and completeness, and I agree with the above. - Coralie Common, MD

## 2022-03-09 ENCOUNTER — Ambulatory Visit (INDEPENDENT_AMBULATORY_CARE_PROVIDER_SITE_OTHER): Payer: 59 | Admitting: Family Medicine

## 2022-03-09 VITALS — BP 125/76 | HR 73 | Temp 98.2°F | Ht 68.0 in | Wt 284.0 lb

## 2022-03-09 DIAGNOSIS — E669 Obesity, unspecified: Secondary | ICD-10-CM

## 2022-03-09 DIAGNOSIS — E559 Vitamin D deficiency, unspecified: Secondary | ICD-10-CM | POA: Diagnosis not present

## 2022-03-09 DIAGNOSIS — I10 Essential (primary) hypertension: Secondary | ICD-10-CM

## 2022-03-09 DIAGNOSIS — Z6841 Body Mass Index (BMI) 40.0 and over, adult: Secondary | ICD-10-CM | POA: Diagnosis not present

## 2022-03-09 MED ORDER — VITAMIN D (ERGOCALCIFEROL) 1.25 MG (50000 UNIT) PO CAPS: 50000.0000 [IU] | ORAL_CAPSULE | ORAL | 0 refills | Status: DC

## 2022-03-09 NOTE — Progress Notes (Deleted)
Patient celebrated her birthday on Valentine's Day.  She did indulge in strawberry shortcake and pound cake and went to the beach and gained 5 pounds.  She stayed at her friend's condo while away.  Next few weeks she doesn't have much in terms of activities, events or travel.  Still visiting her dad in rehab.  Biggest obstacle is her husband and making food that is for both of them. Is tired at the end of the day and doesn't necessarily want to cook.

## 2022-03-10 ENCOUNTER — Encounter (INDEPENDENT_AMBULATORY_CARE_PROVIDER_SITE_OTHER): Payer: Self-pay | Admitting: Family Medicine

## 2022-03-15 NOTE — Progress Notes (Signed)
Chief Complaint:   OBESITY Nicole Pacheco is here to discuss her progress with her obesity treatment plan along with follow-up of her obesity related diagnoses. [name] is on the Category 4 Plan and keeping a food journal and adhering to recommended goals of 450-600 calories and 40+ grams of protein and states she is following her eating plan approximately 50% of the time. [name] states she is exercising 0 minutes 0 times per week.  Today's visit was #: 21 Starting weight: 308 lbs Starting date: 08/26/2020 Today's weight: 284 lbs Today's date: 03/09/2022 Total lbs lost to date: 24 lbs Total lbs lost since last in-office visit: 0  Interim History: [name] celebrated her birthday on Valentine's Day.  She did indulge in strawberry shortcake and pound cake and went to the beach and gained 5 pounds.  She stayed at her friend's condo while away.  Next few weeks she doesn't have much in terms of activities, events or travel.  Still visiting her dad in rehab.  Biggest obstacle is her husband and making food that is for both of them. Is tired at the end of the day and doesn't necessarily want to cook.    Subjective:   1. Vitamin D deficiency Elize is currently taking prescription Vit D 50,000 IU once a week.  Denies any nausea, vomiting or muscle weakness.  She notes fatigue.  2. Essential hypertension Blood pressure controlled today.  Denies chest pain, chest pressure and headache.  On HCTZ and Lisinopril.  Assessment/Plan:   1. Vitamin D deficiency We will refill Vit D 50K IU once a week for 1 month with 0 refills.  -Refill Vitamin D, Ergocalciferol, (DRISDOL) 1.25 MG (50000 UNIT) CAPS capsule; Take 1 capsule (50,000 Units total) by mouth every 7 (seven) days.  Dispense: 4 capsule; Refill: 0  2. Essential hypertension Continue current medications without any changes in dose.  3. BMI 40.0-44.9, adult ([place])  4. Obesity with starting BMI of 46.8 Sarabella is currently in the action stage of change. As  such, her goal is to continue with weight loss efforts. She has agreed to the Category 4 Plan and keeping a food journal and adhering to recommended goals of 450-600 calories and 40+ grams of protein at supper.   Discussed trying the [employer] app to get more ideas as to now prepared foods measure up.  Exercise goals: All adults should avoid inactivity. Some physical activity is better than none, and adults who participate in any amount of physical activity gain some health benefits.  Behavioral modification strategies: increasing lean protein intake, meal planning and cooking strategies, keeping healthy foods in the home, and planning for success.  [name] has agreed to follow-up with our clinic in 4 weeks. She was informed of the importance of frequent follow-up visits to maximize her success with intensive lifestyle modifications for her multiple health conditions.   Objective:   Blood pressure 125/76, pulse 73, temperature 98.2 F (36.8 C), height '5\' 8"'$  (1.727 m), weight 284 lb (128.8 kg), SpO2 99 %. Body mass index is 43.18 kg/m.  General: Cooperative, alert, well developed, in no acute distress. HEENT: Conjunctivae and lids unremarkable. Cardiovascular: Regular rhythm.  Lungs: Normal work of breathing. Neurologic: No focal deficits.   Lab Results  Component Value Date   CREATININE 0.69 09/27/2021   BUN 13 09/27/2021   NA 141 09/27/2021   K 4.2 09/27/2021   CL 104 09/27/2021   CO2 20 09/27/2021   Lab Results  Component Value Date  ALT 12 09/27/2021   AST 12 09/27/2021   ALKPHOS 88 09/27/2021   BILITOT 0.6 09/27/2021   Lab Results  Component Value Date   HGBA1C 5.4 09/27/2021   HGBA1C 5.5 02/24/2021   Lab Results  Component Value Date   INSULIN 21.4 09/27/2021   INSULIN 26.4 (H) 02/24/2021   INSULIN 42.6 (H) 08/26/2020   No results found for: "TSH" Lab Results  Component Value Date   CHOL 168 09/27/2021   HDL 54 09/27/2021   LDLCALC 99 09/27/2021   TRIG 77  09/27/2021   Lab Results  Component Value Date   VD25OH 46.0 09/27/2021   VD25OH 41.6 02/24/2021   VD25OH 30.4 08/26/2020   Lab Results  Component Value Date   WBC 8.8 05/13/2014   HGB 13.2 05/13/2014   HCT 39.2 05/13/2014   MCV 85.4 05/13/2014   PLT 247 05/13/2014   No results found for: "IRON", "TIBC", "FERRITIN"  Attestation Statements:   Reviewed by clinician on day of visit: allergies, medications, problem list, medical history, surgical history, family history, social history, and previous encounter notes.  I, Elnora Morrison, RMA am acting as transcriptionist for Coralie Common, MD. I have reviewed the above documentation for accuracy and completeness, and I agree with the above. - Coralie Common, MD

## 2022-04-07 ENCOUNTER — Encounter (INDEPENDENT_AMBULATORY_CARE_PROVIDER_SITE_OTHER): Payer: Self-pay | Admitting: Family Medicine

## 2022-04-07 ENCOUNTER — Ambulatory Visit (INDEPENDENT_AMBULATORY_CARE_PROVIDER_SITE_OTHER): Payer: 59 | Admitting: Family Medicine

## 2022-04-07 VITALS — BP 135/80 | HR 88 | Temp 98.5°F | Ht 68.0 in | Wt 284.0 lb

## 2022-04-07 DIAGNOSIS — E559 Vitamin D deficiency, unspecified: Secondary | ICD-10-CM

## 2022-04-07 DIAGNOSIS — E669 Obesity, unspecified: Secondary | ICD-10-CM

## 2022-04-07 DIAGNOSIS — I10 Essential (primary) hypertension: Secondary | ICD-10-CM

## 2022-04-07 DIAGNOSIS — Z6841 Body Mass Index (BMI) 40.0 and over, adult: Secondary | ICD-10-CM | POA: Diagnosis not present

## 2022-04-07 MED ORDER — VITAMIN D (ERGOCALCIFEROL) 1.25 MG (50000 UNIT) PO CAPS: 50000.0000 [IU] | ORAL_CAPSULE | ORAL | 0 refills | Status: DC

## 2022-04-07 NOTE — Progress Notes (Deleted)
Patient has been more stressed recently due to placement issues with her father. She is dealing with figuring out what will happen when her dad is discharged from rehab. She is then stressed about the meal plan and what food she is supposed to eat.  Breakfast and lunch tend to be easy and she can eat most of the food most of the time.  Dinner is easy options or grab and go or takeout.  If eating out won't be fast food more like family style restaurants.

## 2022-04-12 NOTE — Progress Notes (Signed)
Chief Complaint:   OBESITY Nicole Pacheco is here to discuss her progress with her obesity treatment plan along with follow-up of her obesity related diagnoses. Nicole Pacheco is on the Category 4 plan and keeping a food journal with goal of 450-600 calories and 40+ grams of protein daily and states she is following her eating plan approximately 40% of the time. Nicole Pacheco states she is walking 1 time per week per week.  Today's visit was #: 22 Starting weight: 308 lbs Starting date: 08/26/20 Today's weight: 284 lbs Today's date: 04/07/22 Total lbs lost to date: 24 Total lbs lost since last in-office visit: 0  Interim History:  Patient has been more stressed recently due to placement issues with her father. She is dealing with figuring out what will happen when her dad is discharged from rehab. She is then stressed about the meal plan and what food she is supposed to eat.  Breakfast and lunch tend to be easy and she can eat most of the food most of the time.  Dinner is easy options or grab and go or takeout.  If eating out won't be fast food more like family style restaurants. Subjective:   1. Vitamin D deficiency Last level below goal. Positive for fatigue.  2. Essential hypertension Blood pressure controlled today. No chest pressure, chest pain, headache.  Assessment/Plan:   1. Vitamin D deficiency Refill- - Vitamin D, Ergocalciferol, (DRISDOL) 1.25 MG (50000 UNIT) CAPS capsule; Take 1 capsule (50,000 Units total) by mouth every 7 (seven) days.  Dispense: 4 capsule; Refill: 0  2. Essential hypertension Continue current medications.  No change in dose.  3. BMI 40.0-44.9, adult ([place]) Obesity with starting BMI of 46.8 Nicole Pacheco is currently in the action stage of change. As such, her goal is to continue with weight loss efforts. She has agreed to the Category 4 plan and journaling supper with goal of 450-600 calories and 40+ grams of protein.  Exercise goals: All adults should avoid inactivity. Some  physical activity is better than none, and adults who participate in any amount of physical activity gain some health benefits.  Behavioral modification strategies: increasing lean protein intake, meal planning and cooking strategies, ways to avoid night time snacking, better snacking choices, emotional eating strategies, and planning for success.  Nicole Pacheco has agreed to follow-up with our clinic in 4 weeks, fasting. She was informed of the importance of frequent follow-up visits to maximize her success with intensive lifestyle modifications for her multiple health conditions.   Objective:   Blood pressure 135/80, pulse 88, temperature 98.5 F (36.9 C), height 5\' 8"  (1.727 m), weight 284 lb (128.8 kg), SpO2 99 %. Body mass index is 43.18 kg/m.  General: Cooperative, alert, well developed, in no acute distress. HEENT: Conjunctivae and lids unremarkable. Cardiovascular: Regular rhythm.  Lungs: Normal work of breathing. Neurologic: No focal deficits.   Lab Results  Component Value Date   CREATININE 0.69 09/27/2021   BUN 13 09/27/2021   NA 141 09/27/2021   K 4.2 09/27/2021   CL 104 09/27/2021   CO2 20 09/27/2021   Lab Results  Component Value Date   ALT 12 09/27/2021   AST 12 09/27/2021   ALKPHOS 88 09/27/2021   BILITOT 0.6 09/27/2021   Lab Results  Component Value Date   HGBA1C 5.4 09/27/2021   HGBA1C 5.5 02/24/2021   Lab Results  Component Value Date   INSULIN 21.4 09/27/2021   INSULIN 26.4 (H) 02/24/2021   INSULIN 42.6 (H) 08/26/2020  No results found for: "TSH" Lab Results  Component Value Date   CHOL 168 09/27/2021   HDL 54 09/27/2021   LDLCALC 99 09/27/2021   TRIG 77 09/27/2021   Lab Results  Component Value Date   VD25OH 46.0 09/27/2021   VD25OH 41.6 02/24/2021   VD25OH 30.4 08/26/2020   Lab Results  Component Value Date   WBC 8.8 05/13/2014   HGB 13.2 05/13/2014   HCT 39.2 05/13/2014   MCV 85.4 05/13/2014   PLT 247 05/13/2014   No results found  for: "IRON", "TIBC", "FERRITIN"  Attestation Statements:   Reviewed by clinician on day of visit: allergies, medications, problem list, medical history, surgical history, family history, social history, and previous encounter notes.  I, Dawn Whitmire, FNP-C, am acting as Location manager for Coralie Common, MD.  I have reviewed the above documentation for accuracy and completeness, and I agree with the above. - Coralie Common, MD

## 2022-04-28 ENCOUNTER — Ambulatory Visit (INDEPENDENT_AMBULATORY_CARE_PROVIDER_SITE_OTHER): Payer: 59 | Admitting: Family Medicine

## 2022-05-11 ENCOUNTER — Other Ambulatory Visit (INDEPENDENT_AMBULATORY_CARE_PROVIDER_SITE_OTHER): Payer: Self-pay | Admitting: Family Medicine

## 2022-05-11 DIAGNOSIS — E559 Vitamin D deficiency, unspecified: Secondary | ICD-10-CM

## 2022-05-30 ENCOUNTER — Ambulatory Visit (INDEPENDENT_AMBULATORY_CARE_PROVIDER_SITE_OTHER): Payer: 59 | Admitting: Family Medicine

## 2022-05-30 ENCOUNTER — Encounter (INDEPENDENT_AMBULATORY_CARE_PROVIDER_SITE_OTHER): Payer: Self-pay | Admitting: Family Medicine

## 2022-05-30 VITALS — BP 112/71 | HR 81 | Temp 98.5°F | Ht 68.0 in | Wt 285.0 lb

## 2022-05-30 DIAGNOSIS — E559 Vitamin D deficiency, unspecified: Secondary | ICD-10-CM

## 2022-05-30 DIAGNOSIS — F3289 Other specified depressive episodes: Secondary | ICD-10-CM

## 2022-05-30 DIAGNOSIS — Z6841 Body Mass Index (BMI) 40.0 and over, adult: Secondary | ICD-10-CM

## 2022-05-30 DIAGNOSIS — I10 Essential (primary) hypertension: Secondary | ICD-10-CM | POA: Diagnosis not present

## 2022-05-30 DIAGNOSIS — E669 Obesity, unspecified: Secondary | ICD-10-CM

## 2022-05-30 MED ORDER — VITAMIN D (ERGOCALCIFEROL) 1.25 MG (50000 UNIT) PO CAPS: 50000.0000 [IU] | ORAL_CAPSULE | ORAL | 0 refills | Status: DC

## 2022-05-30 NOTE — Progress Notes (Signed)
Chief Complaint:   OBESITY Nicole Pacheco is here to discuss her progress with her obesity treatment plan along with follow-up of her obesity related diagnoses. [name] is on the Category 4 Plan and keeping a food journal and adhering to recommended goals of 450-600 calories and 40+ grams of protein at supper and states she is following her eating plan approximately 25% of the time. [name] states she is doing some walking.    Today's visit was #: 23 Starting weight: 308 lbs Starting date: 08/26/2020 Today's weight: 285 lbs Today's date: 05/30/2022 Total lbs lost to date: 23 Total lbs lost since last in-office visit: 0  Interim History: Patient has quite a bit of medical things in the last few weeks- father had covid, she had a kidney stone. She voices she has concerns about her father.  She tried factor meals and enjoyed them but voices that those were expensive.  She knows the meal plan but mentions that with life she isn't following plan.  Snacking at night still an issue.  Subjective:   1. Vitamin D deficiency [name] is on prescription vitamin D.  She denies nausea, vomiting, or muscle weakness, but she notes fatigue.  2. Essential hypertension [name]'s blood pressure is controlled today.  She denies chest pain, chest pressure, or headache.  3. Other depression, with emotional eating [name] is not on medications, and she denies suicidal or homicidal ideations.  Assessment/Plan:   1. Vitamin D deficiency We will refill prescription vitamin D for 1 month.  Solmayra will have labs with her PCP in June.  - Vitamin D, Ergocalciferol, (DRISDOL) 1.25 MG (50000 UNIT) CAPS capsule; Take 1 capsule (50,000 Units total) by mouth every 7 (seven) days.  Dispense: 4 capsule; Refill: 0  2. Essential hypertension [name] will continue her current medications with no change in dose.  3. Other depression, with emotional eating We discussed at length medication options, but with a history of kidney stones  topiramate is not appropriate.  We discussed possibly starting Wellbutrin or naltrexone with nighttime eating.  4. BMI 40.0-44.9, adult (HCC)  5. Obesity with starting BMI of 46.8 [name] is currently in the action stage of change. As such, her goal is to continue with weight loss efforts. She has agreed to the Category 4 Plan.   Exercise goals: All adults should avoid inactivity. Some physical activity is better than none, and adults who participate in any amount of physical activity gain some health benefits.  Behavioral modification strategies: increasing lean protein intake, meal planning and cooking strategies, keeping healthy foods in the home, and planning for success.  [name] has agreed to follow-up with our clinic in 4 weeks. She was informed of the importance of frequent follow-up visits to maximize her success with intensive lifestyle modifications for her multiple health conditions.   Objective:   Blood pressure 112/71, pulse 81, temperature 98.5 F (36.9 C), height 5\' 8"  (1.727 m), weight 285 lb (129.3 kg), SpO2 100 %. Body mass index is 43.33 kg/m.  General: Cooperative, alert, well developed, in no acute distress. HEENT: Conjunctivae and lids unremarkable. Cardiovascular: Regular rhythm.  Lungs: Normal work of breathing. Neurologic: No focal deficits.   Lab Results  Component Value Date   CREATININE 0.69 09/27/2021   BUN 13 09/27/2021   NA 141 09/27/2021   K 4.2 09/27/2021   CL 104 09/27/2021   CO2 20 09/27/2021   Lab Results  Component Value Date   ALT 12 09/27/2021   AST 12 09/27/2021  ALKPHOS 88 09/27/2021   BILITOT 0.6 09/27/2021   Lab Results  Component Value Date   HGBA1C 5.4 09/27/2021   HGBA1C 5.5 02/24/2021   Lab Results  Component Value Date   INSULIN 21.4 09/27/2021   INSULIN 26.4 (H) 02/24/2021   INSULIN 42.6 (H) 08/26/2020   No results found for: "TSH" Lab Results  Component Value Date   CHOL 168 09/27/2021   HDL 54 09/27/2021    LDLCALC 99 09/27/2021   TRIG 77 09/27/2021   Lab Results  Component Value Date   VD25OH 46.0 09/27/2021   VD25OH 41.6 02/24/2021   VD25OH 30.4 08/26/2020   Lab Results  Component Value Date   WBC 8.8 05/13/2014   HGB 13.2 05/13/2014   HCT 39.2 05/13/2014   MCV 85.4 05/13/2014   PLT 247 05/13/2014   No results found for: "IRON", "TIBC", "FERRITIN"  Attestation Statements:   Reviewed by clinician on day of visit: allergies, medications, problem list, medical history, surgical history, family history, social history, and previous encounter notes.   I, Burt Knack, am acting as transcriptionist for Reuben Likes, MD.  I have reviewed the above documentation for accuracy and completeness, and I agree with the above. - Reuben Likes, MD

## 2022-06-28 ENCOUNTER — Encounter (INDEPENDENT_AMBULATORY_CARE_PROVIDER_SITE_OTHER): Payer: Self-pay | Admitting: Family Medicine

## 2022-06-28 ENCOUNTER — Ambulatory Visit (INDEPENDENT_AMBULATORY_CARE_PROVIDER_SITE_OTHER): Payer: 59 | Admitting: Family Medicine

## 2022-06-28 VITALS — BP 131/78 | HR 73 | Temp 98.2°F | Ht 68.0 in | Wt 288.0 lb

## 2022-06-28 DIAGNOSIS — E88819 Insulin resistance, unspecified: Secondary | ICD-10-CM | POA: Diagnosis not present

## 2022-06-28 DIAGNOSIS — F32A Depression, unspecified: Secondary | ICD-10-CM

## 2022-06-28 DIAGNOSIS — E669 Obesity, unspecified: Secondary | ICD-10-CM

## 2022-06-28 DIAGNOSIS — F419 Anxiety disorder, unspecified: Secondary | ICD-10-CM | POA: Diagnosis not present

## 2022-06-28 DIAGNOSIS — E559 Vitamin D deficiency, unspecified: Secondary | ICD-10-CM | POA: Diagnosis not present

## 2022-06-28 DIAGNOSIS — Z6841 Body Mass Index (BMI) 40.0 and over, adult: Secondary | ICD-10-CM

## 2022-06-28 MED ORDER — VITAMIN D (ERGOCALCIFEROL) 1.25 MG (50000 UNIT) PO CAPS
50000.0000 [IU] | ORAL_CAPSULE | ORAL | 0 refills | Status: DC
Start: 2022-06-28 — End: 2022-07-27

## 2022-06-28 MED ORDER — SERTRALINE HCL 50 MG PO TABS
25.0000 mg | ORAL_TABLET | Freq: Every day | ORAL | 0 refills | Status: DC
Start: 2022-06-28 — End: 2022-07-27
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
## 2022-07-27 ENCOUNTER — Encounter (INDEPENDENT_AMBULATORY_CARE_PROVIDER_SITE_OTHER): Payer: Self-pay | Admitting: Family Medicine

## 2022-07-27 ENCOUNTER — Telehealth (INDEPENDENT_AMBULATORY_CARE_PROVIDER_SITE_OTHER): Payer: 59 | Admitting: Family Medicine

## 2022-07-27 DIAGNOSIS — E559 Vitamin D deficiency, unspecified: Secondary | ICD-10-CM

## 2022-07-27 DIAGNOSIS — F419 Anxiety disorder, unspecified: Secondary | ICD-10-CM | POA: Diagnosis not present

## 2022-07-27 DIAGNOSIS — E669 Obesity, unspecified: Secondary | ICD-10-CM

## 2022-07-27 DIAGNOSIS — F32A Depression, unspecified: Secondary | ICD-10-CM | POA: Diagnosis not present

## 2022-07-27 DIAGNOSIS — Z6841 Body Mass Index (BMI) 40.0 and over, adult: Secondary | ICD-10-CM

## 2022-07-27 MED ORDER — SERTRALINE HCL 50 MG PO TABS: 50.0000 mg | ORAL_TABLET | Freq: Every day | ORAL | 0 refills | Status: DC

## 2022-07-27 MED ORDER — VITAMIN D (ERGOCALCIFEROL) 1.25 MG (50000 UNIT) PO CAPS: 50000.0000 [IU] | ORAL_CAPSULE | ORAL | 0 refills | Status: DC

## 2022-07-27 NOTE — Progress Notes (Signed)
TeleHealth Visit:  Due to the COVID-19 pandemic, this visit was completed with telemedicine (audio/video) technology to reduce patient and provider exposure as well as to preserve personal protective equipment.   Nicole Pacheco has verbally consented to this TeleHealth visit. The patient is located at home, the provider is located at home. The participants in this visit include the listed provider and patient. The visit was conducted today via MyChart video.   Chief Complaint: OBESITY Nicole Pacheco is here to discuss her progress with her obesity treatment plan along with follow-up of her obesity related diagnoses. Nicole Pacheco is on practicing portion control and making smarter food choices, such as increasing vegetables and decreasing simple carbohydrates and states she is following her eating plan approximately 50% of the time. Nicole Pacheco states she is doing 0 minutes 0 times per week.  Today's visit was #: 25 Starting weight: 308 lbs Starting date: 08/26/2021  Interim History: Patient had a good July 4th- she went to her husband's brothers house and ate and watched fireworks. 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  She had a cystoscopy on 07/05/22 and currently has a stent in.  She is planning to go to the beach at the end of July [facility]) and normally eat breakfast in and then snacks at beach and goes out to dinner later on.  Subjective:   1. Vitamin D deficiency Patient denies nausea, vomiting, or muscle weakness but notes fatigue.  2. Anxiety and depression Patient feels better but not great.  She is still taking sertraline 25 mg, and she denies suicidal or homicidal ideations.  Assessment/Plan:   1. Vitamin D deficiency Patient will continue prescription vitamin D, and we will refill for 1 month.  - Vitamin D, Ergocalciferol, (DRISDOL) 1.25 MG (50000 UNIT) CAPS capsule; Take 1 capsule (50,000 Units total) by mouth every 7 (seven)  days.  Dispense: 4 capsule; Refill: 0  2. Anxiety and depression Patient will continue sertraline at 50 mg once daily, and we will refill for 90 days.  - sertraline (ZOLOFT) 50 MG tablet; Take 1 tablet (50 mg total) by mouth daily.  Dispense: 90 tablet; Refill: 0  3. BMI 40.0-44.9, adult (HCC)  4. Obesity with starting BMI of 46.8 Nicole Pacheco is currently in the action stage of change. As such, her goal is to continue with weight loss efforts. She has agreed to the Category 4 Plan.   Exercise goals: All adults should avoid inactivity. Some physical activity is better than none, and adults who participate in any amount of physical activity gain some health benefits.  Behavioral modification strategies: increasing lean protein intake, meal planning and cooking strategies, keeping healthy foods in the home, and planning for success.  Nicole Pacheco has agreed to follow-up with our clinic in 5 weeks. She was informed of the importance of frequent follow-up visits to maximize her success with intensive lifestyle modifications for her multiple health conditions.  Objective:   VITALS: Per patient if applicable, see vitals. GENERAL: Alert and in no acute distress. CARDIOPULMONARY: No increased WOB. Speaking in clear sentences.  PSYCH: Pleasant and cooperative. Speech normal rate and rhythm. Affect is appropriate. Insight and judgement are appropriate. Attention is focused, linear, and appropriate.  NEURO: Oriented as arrived to appointment on time with no prompting.   Lab Results  Component Value Date   CREATININE 0.69 09/27/2021   BUN 13 09/27/2021   NA 141 09/27/2021   K 4.2  09/27/2021   CL 104 09/27/2021   CO2 20 09/27/2021   Lab Results  Component Value Date   ALT 12 09/27/2021   AST 12 09/27/2021   ALKPHOS 88 09/27/2021   BILITOT 0.6 09/27/2021   Lab Results  Component Value Date   HGBA1C 5.4 09/27/2021   HGBA1C 5.5 02/24/2021   Lab Results  Component Value Date   INSULIN 21.4  09/27/2021   INSULIN 26.4 (H) 02/24/2021   INSULIN 42.6 (H) 08/26/2020   No results found for: "TSH" Lab Results  Component Value Date   CHOL 168 09/27/2021   HDL 54 09/27/2021   LDLCALC 99 09/27/2021   TRIG 77 09/27/2021   Lab Results  Component Value Date   VD25OH 46.0 09/27/2021   VD25OH 41.6 02/24/2021   VD25OH 30.4 08/26/2020   Lab Results  Component Value Date   WBC 8.8 05/13/2014   HGB 13.2 05/13/2014   HCT 39.2 05/13/2014   MCV 85.4 05/13/2014   PLT 247 05/13/2014   No results found for: "IRON", "TIBC", "FERRITIN"  Attestation Statements:   Reviewed by clinician on day of visit: allergies, medications, problem list, medical history, surgical history, family history, social history, and previous encounter notes.   I, Burt Knack, am acting as transcriptionist for Reuben Likes, MD.  I have reviewed the above documentation for accuracy and completeness, and I agree with the above. - Reuben Likes, MD

## 2022-08-29 ENCOUNTER — Ambulatory Visit (INDEPENDENT_AMBULATORY_CARE_PROVIDER_SITE_OTHER): Payer: 59 | Admitting: Family Medicine

## 2022-08-29 ENCOUNTER — Encounter (INDEPENDENT_AMBULATORY_CARE_PROVIDER_SITE_OTHER): Payer: Self-pay | Admitting: Family Medicine

## 2022-08-29 VITALS — BP 114/73 | HR 89 | Temp 98.2°F | Ht 68.0 in | Wt 289.0 lb

## 2022-08-29 DIAGNOSIS — E669 Obesity, unspecified: Secondary | ICD-10-CM | POA: Diagnosis not present

## 2022-08-29 DIAGNOSIS — E559 Vitamin D deficiency, unspecified: Secondary | ICD-10-CM

## 2022-08-29 DIAGNOSIS — I10 Essential (primary) hypertension: Secondary | ICD-10-CM | POA: Diagnosis not present

## 2022-08-29 DIAGNOSIS — Z6841 Body Mass Index (BMI) 40.0 and over, adult: Secondary | ICD-10-CM | POA: Diagnosis not present

## 2022-08-29 DIAGNOSIS — F32A Depression, unspecified: Secondary | ICD-10-CM

## 2022-08-29 MED ORDER — VITAMIN D (ERGOCALCIFEROL) 1.25 MG (50000 UNIT) PO CAPS: 50000.0000 [IU] | ORAL_CAPSULE | ORAL | 0 refills | Status: DC

## 2022-08-29 NOTE — Progress Notes (Signed)
Chief Complaint:   OBESITY Nicole Pacheco is here to discuss her progress with her obesity treatment plan along with follow-up of her obesity related diagnoses. Nicole Pacheco is on the Category 4 Plan and states she is following her eating plan approximately 25% of the time. Nicole Pacheco states she is doing 0 minutes 0 times per week.  Today's visit was #: 26 Starting weight: 308 lbs Starting date: 08/26/2021 Today's weight: 289 lbs Today's date: 08/29/2022 Total lbs lost to date: 19 Total lbs lost since last in-office visit: 0  Interim History: Patient's father died a month ago.  She is feeling lost and is grieving.  She is still dealing with the estate. For self care patient is going to the beach and she is planning on going back in the next week for a few days.  She is also doing some puzzles to keep herself occupied. She is finding she isn't emotionally eating while doing puzzles. She can realize that she is not eating consistently.  Subjective:   1. Vitamin D deficiency Patient is on prescription vitamin D.  She denies nausea, vomiting, or muscle weakness but notes fatigue.  2. Essential hypertension Patient's blood pressure is controlled today.  She denies chest pain, chest pressure, or headache.  She is on lisinopril and hydrochlorothiazide.  Assessment/Plan:   1. Vitamin D deficiency We will refill prescription vitamin D 50,000 IU once weekly for 1 month.  - Vitamin D, Ergocalciferol, (DRISDOL) 1.25 MG (50000 UNIT) CAPS capsule; Take 1 capsule (50,000 Units total) by mouth every 7 (seven) days.  Dispense: 4 capsule; Refill: 0  2. Essential hypertension Patient will continue her current medications with no change in medication or dose.  3. BMI 40.0-44.9, adult (HCC)  4. Obesity with starting BMI of 46.8 Nicole Pacheco is currently in the action stage of change. As such, her goal is to continue with weight loss efforts. She has agreed to practicing portion control and making smarter food choices, such  as increasing vegetables and decreasing simple carbohydrates.   We will repeat fasting labs at her next appointment.   Exercise goals: No exercise has been prescribed at this time.  Behavioral modification strategies: increasing lean protein intake, meal planning and cooking strategies, keeping healthy foods in the home, and planning for success.  Nicole Pacheco has agreed to follow-up with our clinic in 5 weeks. She was informed of the importance of frequent follow-up visits to maximize her success with intensive lifestyle modifications for her multiple health conditions.   Objective:   Blood pressure 114/73, pulse 89, temperature 98.2 F (36.8 C), height 5\' 8"  (1.727 m), weight 289 lb (131.1 kg), SpO2 99%. Body mass index is 43.94 kg/m.  General: Cooperative, alert, well developed, in no acute distress. HEENT: Conjunctivae and lids unremarkable. Cardiovascular: Regular rhythm.  Lungs: Normal work of breathing. Neurologic: No focal deficits.   Lab Results  Component Value Date   CREATININE 0.69 09/27/2021   BUN 13 09/27/2021   NA 141 09/27/2021   K 4.2 09/27/2021   CL 104 09/27/2021   CO2 20 09/27/2021   Lab Results  Component Value Date   ALT 12 09/27/2021   AST 12 09/27/2021   ALKPHOS 88 09/27/2021   BILITOT 0.6 09/27/2021   Lab Results  Component Value Date   HGBA1C 5.4 09/27/2021   HGBA1C 5.5 02/24/2021   Lab Results  Component Value Date   INSULIN 21.4 09/27/2021   INSULIN 26.4 (H) 02/24/2021   INSULIN 42.6 (H) 08/26/2020   No  results found for: "TSH" Lab Results  Component Value Date   CHOL 168 09/27/2021   HDL 54 09/27/2021   LDLCALC 99 09/27/2021   TRIG 77 09/27/2021   Lab Results  Component Value Date   VD25OH 46.0 09/27/2021   VD25OH 41.6 02/24/2021   VD25OH 30.4 08/26/2020   Lab Results  Component Value Date   WBC 8.8 05/13/2014   HGB 13.2 05/13/2014   HCT 39.2 05/13/2014   MCV 85.4 05/13/2014   PLT 247 05/13/2014   No results found for:  "IRON", "TIBC", "FERRITIN"  Attestation Statements:   Reviewed by clinician on day of visit: allergies, medications, problem list, medical history, surgical history, family history, social history, and previous encounter notes.   I, Burt Knack, am acting as transcriptionist for Reuben Likes, MD.  I have reviewed the above documentation for accuracy and completeness, and I agree with the above. - Reuben Likes, MD

## 2022-10-13 ENCOUNTER — Ambulatory Visit (INDEPENDENT_AMBULATORY_CARE_PROVIDER_SITE_OTHER): Payer: 59 | Admitting: Family Medicine

## 2022-10-13 ENCOUNTER — Encounter (INDEPENDENT_AMBULATORY_CARE_PROVIDER_SITE_OTHER): Payer: Self-pay | Admitting: Family Medicine

## 2022-10-13 VITALS — BP 143/81 | HR 75 | Temp 98.2°F | Ht 68.0 in | Wt 290.0 lb

## 2022-10-13 DIAGNOSIS — E669 Obesity, unspecified: Secondary | ICD-10-CM | POA: Diagnosis not present

## 2022-10-13 DIAGNOSIS — F419 Anxiety disorder, unspecified: Secondary | ICD-10-CM

## 2022-10-13 DIAGNOSIS — E559 Vitamin D deficiency, unspecified: Secondary | ICD-10-CM | POA: Diagnosis not present

## 2022-10-13 DIAGNOSIS — F32A Depression, unspecified: Secondary | ICD-10-CM

## 2022-10-13 DIAGNOSIS — Z6841 Body Mass Index (BMI) 40.0 and over, adult: Secondary | ICD-10-CM

## 2022-10-13 MED ORDER — SERTRALINE HCL 50 MG PO TABS: 50.0000 mg | ORAL_TABLET | Freq: Every day | ORAL | 0 refills | Status: DC

## 2022-10-13 MED ORDER — VITAMIN D (ERGOCALCIFEROL) 1.25 MG (50000 UNIT) PO CAPS: 50000.0000 [IU] | ORAL_CAPSULE | ORAL | 0 refills | Status: DC

## 2022-10-13 NOTE — Progress Notes (Signed)
Chief Complaint:   OBESITY Nicole Pacheco is here to discuss her progress with her obesity treatment plan along with follow-up of her obesity related diagnoses. Nicole Pacheco is on practicing portion control and making smarter food choices, such as increasing vegetables and decreasing simple carbohydrates and states she is following her eating plan approximately 25% of the time. Nicole Pacheco states she has been doing a little bit of walking.   Today's visit was #: 27 Starting weight: 308 lbs Starting date: 08/26/2021 Today's weight: 290 lbs Today's date: 10/13/2022 Total lbs lost to date: 18 Total lbs lost since last in-office visit: 0  Interim History: Since last appointment she is trying to get into a new normal routine.  She is also trying to settle her father's estate.  She is still working. Her grief is fluctuating but she isn't sure if she has time to grieve as she is dealing with all the estate filings. She hasn't really been able to creat a consistent schedule or routine.  She doesn't have motivation to cook so doing lots of grab and go options. No upcoming plans or events anticipated. She has started crafting again.  She does have a good support group of friends and church.  Subjective:   1. Vitamin D deficiency Patient's last vitamin D level was of 43.6.  She denies nausea, vomiting, or muscle weakness but notes fatigue.  2. Anxiety and depression Patient is on sertraline daily.  She denies suicidal or homicidal ideations.  Her symptoms are well-controlled per the patient.  She is still dealing with grief from her father's death.  Assessment/Plan:   1. Vitamin D deficiency We will refill vitamin D 50,000 IU once weekly for 1 month.  - Vitamin D, Ergocalciferol, (DRISDOL) 1.25 MG (50000 UNIT) CAPS capsule; Take 1 capsule (50,000 Units total) by mouth every 7 (seven) days.  Dispense: 4 capsule; Refill: 0  2. Anxiety and depression We will refill sertraline 50 mg once daily for 90 days.  -  sertraline (ZOLOFT) 50 MG tablet; Take 1 tablet (50 mg total) by mouth daily.  Dispense: 90 tablet; Refill: 0  3. BMI 40.0-44.9, adult (HCC)  4. Obesity with starting BMI of 46.8 Nicole Pacheco is currently in the action stage of change. As such, her goal is to continue with weight loss efforts. She has agreed to practicing portion control and making smarter food choices, such as increasing vegetables and decreasing simple carbohydrates.   Exercise goals: All adults should avoid inactivity. Some physical activity is better than none, and adults who participate in any amount of physical activity gain some health benefits.  Behavioral modification strategies: increasing lean protein intake, meal planning and cooking strategies, keeping healthy foods in the home, and planning for success.  Nicole Pacheco has agreed to follow-up with our clinic in 6 weeks. She was informed of the importance of frequent follow-up visits to maximize her success with intensive lifestyle modifications for her multiple health conditions.   Objective:   Blood pressure (!) 143/81, pulse 75, temperature 98.2 F (36.8 C), height 5\' 8"  (1.727 m), weight 290 lb (131.5 kg), SpO2 98%. Body mass index is 44.09 kg/m.  General: Cooperative, alert, well developed, in no acute distress. HEENT: Conjunctivae and lids unremarkable. Cardiovascular: Regular rhythm.  Lungs: Normal work of breathing. Neurologic: No focal deficits.   Lab Results  Component Value Date   CREATININE 0.69 09/27/2021   BUN 13 09/27/2021   NA 141 09/27/2021   K 4.2 09/27/2021   CL 104 09/27/2021  CO2 20 09/27/2021   Lab Results  Component Value Date   ALT 12 09/27/2021   AST 12 09/27/2021   ALKPHOS 88 09/27/2021   BILITOT 0.6 09/27/2021   Lab Results  Component Value Date   HGBA1C 5.4 09/27/2021   HGBA1C 5.5 02/24/2021   Lab Results  Component Value Date   INSULIN 21.4 09/27/2021   INSULIN 26.4 (H) 02/24/2021   INSULIN 42.6 (H) 08/26/2020   No  results found for: "TSH" Lab Results  Component Value Date   CHOL 168 09/27/2021   HDL 54 09/27/2021   LDLCALC 99 09/27/2021   TRIG 77 09/27/2021   Lab Results  Component Value Date   VD25OH 46.0 09/27/2021   VD25OH 41.6 02/24/2021   VD25OH 30.4 08/26/2020   Lab Results  Component Value Date   WBC 8.8 05/13/2014   HGB 13.2 05/13/2014   HCT 39.2 05/13/2014   MCV 85.4 05/13/2014   PLT 247 05/13/2014   No results found for: "IRON", "TIBC", "FERRITIN"  Attestation Statements:   Reviewed by clinician on day of visit: allergies, medications, problem list, medical history, surgical history, family history, social history, and previous encounter notes.   I, Burt Knack, am acting as transcriptionist for Reuben Likes, MD.  I have reviewed the above documentation for accuracy and completeness, and I agree with the above. - Reuben Likes, MD

## 2022-10-31 ENCOUNTER — Other Ambulatory Visit (INDEPENDENT_AMBULATORY_CARE_PROVIDER_SITE_OTHER): Payer: Self-pay | Admitting: Family Medicine

## 2022-10-31 DIAGNOSIS — F32A Depression, unspecified: Secondary | ICD-10-CM

## 2022-10-31 DIAGNOSIS — F419 Anxiety disorder, unspecified: Secondary | ICD-10-CM

## 2022-11-16 ENCOUNTER — Other Ambulatory Visit: Payer: Self-pay | Admitting: Obstetrics and Gynecology

## 2022-11-16 DIAGNOSIS — Z803 Family history of malignant neoplasm of breast: Secondary | ICD-10-CM

## 2022-11-24 ENCOUNTER — Telehealth (INDEPENDENT_AMBULATORY_CARE_PROVIDER_SITE_OTHER): Payer: 59 | Admitting: Family Medicine

## 2022-11-24 ENCOUNTER — Encounter (INDEPENDENT_AMBULATORY_CARE_PROVIDER_SITE_OTHER): Payer: Self-pay | Admitting: Family Medicine

## 2022-11-24 DIAGNOSIS — E88819 Insulin resistance, unspecified: Secondary | ICD-10-CM | POA: Insufficient documentation

## 2022-11-24 DIAGNOSIS — E669 Obesity, unspecified: Secondary | ICD-10-CM | POA: Diagnosis not present

## 2022-11-24 DIAGNOSIS — Z6841 Body Mass Index (BMI) 40.0 and over, adult: Secondary | ICD-10-CM | POA: Diagnosis not present

## 2022-11-24 DIAGNOSIS — E559 Vitamin D deficiency, unspecified: Secondary | ICD-10-CM

## 2022-11-24 MED ORDER — VITAMIN D (ERGOCALCIFEROL) 1.25 MG (50000 UNIT) PO CAPS
50000.0000 [IU] | ORAL_CAPSULE | ORAL | 0 refills | Status: DC
Start: 2022-11-24 — End: 2023-03-16

## 2022-11-24 NOTE — Assessment & Plan Note (Signed)
Discussed importance of vitamin d supplementation.  Last Vitamin D level of 43 in June of this year. Vitamin d supplementation has been shown to decrease fatigue, decrease risk of progression to insulin resistance and then prediabetes, decreases risk of falling in older age and can even assist in decreasing depressive symptoms in PTSD.   Prescription for Vitamin D sent in.

## 2022-11-24 NOTE — Assessment & Plan Note (Signed)
Patient last A1c was a year ago.  Her blood sugar has been elevated and so a repeat A1c needed at next appt.
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
## 2022-12-25 ENCOUNTER — Other Ambulatory Visit: Payer: 59

## 2022-12-27 ENCOUNTER — Ambulatory Visit (INDEPENDENT_AMBULATORY_CARE_PROVIDER_SITE_OTHER): Payer: 59 | Admitting: Family Medicine

## 2022-12-27 VITALS — BP 132/81 | HR 70 | Temp 98.5°F | Ht 68.0 in | Wt 298.0 lb

## 2022-12-27 DIAGNOSIS — F32A Depression, unspecified: Secondary | ICD-10-CM

## 2022-12-27 DIAGNOSIS — E559 Vitamin D deficiency, unspecified: Secondary | ICD-10-CM

## 2022-12-27 DIAGNOSIS — F419 Anxiety disorder, unspecified: Secondary | ICD-10-CM

## 2022-12-27 DIAGNOSIS — E669 Obesity, unspecified: Secondary | ICD-10-CM

## 2022-12-27 DIAGNOSIS — Z6841 Body Mass Index (BMI) 40.0 and over, adult: Secondary | ICD-10-CM

## 2022-12-27 NOTE — Progress Notes (Signed)
   SUBJECTIVE:  Chief Complaint: Obesity  Interim History: Since last appointment patient has been mostly living life.  She doesn't feel like she is doing so well in terms of compliance to meal plan.  She had a good Thanksgiving- she stayed local. She is in process of settling her father's estate.  She is also feeling the demands of the holiday.  If she is at work she is doing well at controlling grazing. She has been eating out more frequently.  She recognizes she comfort eats.  Not sure what she has planned for this month; small Christmas gathering.   Lakea is here to discuss her progress with her obesity treatment plan. She is on the practicing portion control and making smarter food choices, such as increasing vegetables and decreasing simple carbohydrates and states she is following her eating plan approximately 25 % of the time. She states she is not exercising.   OBJECTIVE: Visit Diagnoses: Problem List Items Addressed This Visit       Other   Vitamin D deficiency - Primary   Last lab done in July of this year and was 60.  Will repeat labs end of January to reassess value.  No nausea, vomiting or muscle weakness.  Does not need refill today.      Anxiety and depression   Patient on sertraline 50mg  daily.  No suicidal or homicidal ideation.  She is still grieving the loss of her father (5 months).  This is her first holiday season without both of her parents. She does not need a refill today (got 3 month Rx).  Follow up on mental health at next appointment.      Other Visit Diagnoses       Obesity with starting BMI of 46.8         BMI 45.0-49.9, adult (HCC)           No data recorded  No data recorded  No data recorded  No data recorded    ASSESSMENT AND PLAN:  Diet: Margel is currently in the action stage of change. As such, her goal is to maintain weight for now. She has agreed to practicing portion control and making smarter food choices, such as increasing  vegetables and decreasing simple carbohydrates.  Exercise: Bob has been instructed that some exercise is better than none for weight loss and overall health benefits.   Behavior Modification:  We discussed the following Behavioral Modification Strategies today: increasing lean protein intake, decreasing eating out, keeping healthy foods in the home, emotional eating strategies , and holiday eating strategies.   No follow-ups on file.Marland Kitchen She was informed of the importance of frequent follow up visits to maximize her success with intensive lifestyle modifications for her multiple health conditions.  Attestation Statements:   Reviewed by clinician on day of visit: allergies, medications, problem list, medical history, surgical history, family history, social history, and previous encounter notes.     Reuben Likes, MD

## 2022-12-27 NOTE — Assessment & Plan Note (Signed)
Patient on sertraline 50mg  daily.  No suicidal or homicidal ideation.  She is still grieving the loss of her father (5 months).  This is her first holiday season without both of her parents. She does not need a refill today (got 3 month Rx).  Follow up on mental health at next appointment.

## 2022-12-27 NOTE — Assessment & Plan Note (Signed)
Last lab done in July of this year and was 42.  Will repeat labs end of January to reassess value.  No nausea, vomiting or muscle weakness.  Does not need refill today.

## 2023-01-25 ENCOUNTER — Other Ambulatory Visit (INDEPENDENT_AMBULATORY_CARE_PROVIDER_SITE_OTHER): Payer: Self-pay | Admitting: Family Medicine

## 2023-01-25 DIAGNOSIS — E559 Vitamin D deficiency, unspecified: Secondary | ICD-10-CM

## 2023-02-01 ENCOUNTER — Other Ambulatory Visit (INDEPENDENT_AMBULATORY_CARE_PROVIDER_SITE_OTHER): Payer: Self-pay | Admitting: Family Medicine

## 2023-02-01 DIAGNOSIS — F419 Anxiety disorder, unspecified: Secondary | ICD-10-CM

## 2023-02-01 DIAGNOSIS — E559 Vitamin D deficiency, unspecified: Secondary | ICD-10-CM

## 2023-02-02 ENCOUNTER — Encounter (INDEPENDENT_AMBULATORY_CARE_PROVIDER_SITE_OTHER): Payer: Self-pay | Admitting: Family Medicine

## 2023-02-02 ENCOUNTER — Other Ambulatory Visit (INDEPENDENT_AMBULATORY_CARE_PROVIDER_SITE_OTHER): Payer: Self-pay

## 2023-02-02 DIAGNOSIS — F419 Anxiety disorder, unspecified: Secondary | ICD-10-CM

## 2023-02-02 MED ORDER — SERTRALINE HCL 50 MG PO TABS: 50.0000 mg | ORAL_TABLET | Freq: Every day | ORAL | 0 refills | Status: DC

## 2023-02-04 ENCOUNTER — Other Ambulatory Visit (INDEPENDENT_AMBULATORY_CARE_PROVIDER_SITE_OTHER): Payer: Self-pay | Admitting: Family Medicine

## 2023-02-04 DIAGNOSIS — E559 Vitamin D deficiency, unspecified: Secondary | ICD-10-CM

## 2023-02-07 ENCOUNTER — Ambulatory Visit
Admission: RE | Admit: 2023-02-07 | Discharge: 2023-02-07 | Disposition: A | Discharge status: Home / Self Care | Payer: 59 | Source: Ambulatory Visit | Attending: Obstetrics and Gynecology | Admitting: Obstetrics and Gynecology

## 2023-02-07 DIAGNOSIS — Z803 Family history of malignant neoplasm of breast: Secondary | ICD-10-CM

## 2023-02-07 MED ORDER — GADOPICLENOL 0.5 MMOL/ML IV SOLN
10.0000 mL | Freq: Once | INTRAVENOUS | Status: AC | PRN
  Administered 2023-02-07: 10 mL via INTRAVENOUS

## 2023-02-09 ENCOUNTER — Encounter (INDEPENDENT_AMBULATORY_CARE_PROVIDER_SITE_OTHER): Payer: Self-pay | Admitting: Family Medicine

## 2023-02-09 ENCOUNTER — Ambulatory Visit (INDEPENDENT_AMBULATORY_CARE_PROVIDER_SITE_OTHER): Payer: 59 | Admitting: Family Medicine

## 2023-02-09 VITALS — BP 116/74 | HR 76 | Temp 98.2°F | Ht 68.0 in | Wt 303.0 lb

## 2023-02-09 DIAGNOSIS — E7849 Other hyperlipidemia: Secondary | ICD-10-CM

## 2023-02-09 DIAGNOSIS — F419 Anxiety disorder, unspecified: Secondary | ICD-10-CM

## 2023-02-09 DIAGNOSIS — E88819 Insulin resistance, unspecified: Secondary | ICD-10-CM

## 2023-02-09 DIAGNOSIS — F32A Depression, unspecified: Secondary | ICD-10-CM

## 2023-02-09 DIAGNOSIS — E785 Hyperlipidemia, unspecified: Secondary | ICD-10-CM

## 2023-02-09 DIAGNOSIS — E559 Vitamin D deficiency, unspecified: Secondary | ICD-10-CM

## 2023-02-09 DIAGNOSIS — Z6841 Body Mass Index (BMI) 40.0 and over, adult: Secondary | ICD-10-CM

## 2023-02-09 DIAGNOSIS — R7303 Prediabetes: Secondary | ICD-10-CM

## 2023-02-09 MED ORDER — SERTRALINE HCL 50 MG PO TABS: 50.0000 mg | ORAL_TABLET | Freq: Every day | ORAL | 0 refills | Status: DC

## 2023-02-09 NOTE — Progress Notes (Signed)
SUBJECTIVE:  Chief Complaint: Obesity  Interim History: Patient feeling disappointed in herself.  She feels like she is back to where she started.  She is still dealing with settling the estate of her father. Her brother in law just got moved to the hospice house.  Shonte is here to discuss her progress with her obesity treatment plan. She is on the practicing portion control and making smarter food choices, such as increasing vegetables and decreasing simple carbohydrates and states she is following her eating plan approximately 25 % of the time. She states she is not exercising.   OBJECTIVE: Visit Diagnoses: Problem List Items Addressed This Visit       Endocrine   Insulin resistance   Elevated insulin level previously. She has been less compliant than she would like to be in her meal plan.  She knows she has been more indulgent in her food intake recently due to stress        Other   Vitamin D deficiency - Primary   Patient on RX Vitamin D supplementation previously.  Her fatigue is still significant and unchanged.  Will repeat Vit D lab today.      Relevant Orders   VITAMIN D 25 Hydroxy (Vit-D Deficiency, Fractures)   Anxiety and depression   Patient still grieving loss of her father.  She is taking sertraline and is experiencing symptoms that are mostly manageable.  She needs a refill of zoloft today.      Relevant Medications   sertraline (ZOLOFT) 50 MG tablet   Prediabetes   Relevant Orders   Comprehensive metabolic panel   Hemoglobin A1c   Insulin, random   Hyperlipidemia   LDL slightly elevated a year ago at 106.  She is not on medication.  Will repeat FLP today and plan to discuss them at next appointment.      Relevant Orders   Lipid Panel With LDL/HDL Ratio   Morbid obesity (HCC)   Patient started program in August of 2023 at 309lbs.  She was compliant initially on meal plan but unfortunately has had numerous setbacks in terms of her own health and then the  death of her father.  Dietary changes have not been the forefront of her consciousness.  She wants to start making small changes to pull herself slightly more on track.      Other Visit Diagnoses       BMI 45.0-49.9, adult (HCC)           No data recorded No data recorded  No data recorded No data recorded   ASSESSMENT AND PLAN:  Diet: Alexianna is currently in the action stage of change. As such, her goal is to continue with weight loss efforts. She has agreed to practicing portion control and making smarter food choices, such as increasing vegetables and decreasing simple carbohydrates.  Exercise: Escarlet has been instructed that some exercise is better than none for weight loss and overall health benefits.   Behavior Modification:  We discussed the following Behavioral Modification Strategies today: increasing lean protein intake, decreasing simple carbohydrates, increasing vegetables, and meal planning and cooking strategies.   No follow-ups on file.Marland Kitchen She was informed of the importance of frequent follow up visits to maximize her success with intensive lifestyle modifications for her multiple health conditions.  Attestation Statements:   Reviewed by clinician on day of visit: allergies, medications, problem list, medical history, surgical history, family history, social history, and previous encounter notes.     Reuben Likes,  MD

## 2023-02-10 LAB — INSULIN, RANDOM: INSULIN: 28.7 u[IU]/mL — ABNORMAL HIGH (ref 2.6–24.9)

## 2023-02-10 LAB — VITAMIN D 25 HYDROXY (VIT D DEFICIENCY, FRACTURES): Vit D, 25-Hydroxy: 44.6 ng/mL (ref 30.0–100.0)

## 2023-02-15 DIAGNOSIS — E785 Hyperlipidemia, unspecified: Secondary | ICD-10-CM | POA: Insufficient documentation

## 2023-02-15 DIAGNOSIS — R7303 Prediabetes: Secondary | ICD-10-CM | POA: Insufficient documentation

## 2023-02-15 NOTE — Assessment & Plan Note (Signed)
Elevated insulin level previously. She has been less compliant than she would like to be in her meal plan.  She knows she has been more indulgent in her food intake recently due to stress

## 2023-02-15 NOTE — Assessment & Plan Note (Signed)
Patient started program in August of 2023 at 309lbs.  She was compliant initially on meal plan but unfortunately has had numerous setbacks in terms of her own health and then the death of her father.  Dietary changes have not been the forefront of her consciousness.  She wants to start making small changes to pull herself slightly more on track.

## 2023-02-15 NOTE — Assessment & Plan Note (Signed)
LDL slightly elevated a year ago at 106.  She is not on medication.  Will repeat FLP today and plan to discuss them at next appointment.

## 2023-02-15 NOTE — Assessment & Plan Note (Signed)
Patient still grieving loss of her father.  She is taking sertraline and is experiencing symptoms that are mostly manageable.  She needs a refill of zoloft today.

## 2023-02-15 NOTE — Assessment & Plan Note (Signed)
Patient on RX Vitamin D supplementation previously.  Her fatigue is still significant and unchanged.  Will repeat Vit D lab today.

## 2023-03-02 ENCOUNTER — Other Ambulatory Visit (INDEPENDENT_AMBULATORY_CARE_PROVIDER_SITE_OTHER): Payer: Self-pay | Admitting: Family Medicine

## 2023-03-02 DIAGNOSIS — E559 Vitamin D deficiency, unspecified: Secondary | ICD-10-CM

## 2023-03-16 ENCOUNTER — Encounter (INDEPENDENT_AMBULATORY_CARE_PROVIDER_SITE_OTHER): Payer: Self-pay | Admitting: Family Medicine

## 2023-03-16 ENCOUNTER — Ambulatory Visit (INDEPENDENT_AMBULATORY_CARE_PROVIDER_SITE_OTHER): Payer: 59 | Admitting: Family Medicine

## 2023-03-16 VITALS — BP 145/79 | HR 78 | Temp 98.2°F | Ht 68.0 in | Wt 305.0 lb

## 2023-03-16 DIAGNOSIS — E559 Vitamin D deficiency, unspecified: Secondary | ICD-10-CM | POA: Diagnosis not present

## 2023-03-16 DIAGNOSIS — Z6841 Body Mass Index (BMI) 40.0 and over, adult: Secondary | ICD-10-CM

## 2023-03-16 MED ORDER — VITAMIN D (ERGOCALCIFEROL) 1.25 MG (50000 UNIT) PO CAPS: 50000.0000 [IU] | ORAL_CAPSULE | ORAL | 0 refills | Status: AC

## 2023-03-16 NOTE — Progress Notes (Signed)
   SUBJECTIVE:  Chief Complaint: Obesity  Interim History: Patient is feeling more motivated than she was previously.  She went and surprised her daughter and then she and her husband went on an overnight trip to the beach. She went and bought some prepared food initially but struggled with consistency over the last month.  Her husband is trying to be more helpful as well.  Her niece is having a baby and her baby shower is this weekend.  Work is more stressful and demanding.   Nicole Pacheco is here to discuss her progress with her obesity treatment plan. She is on the practicing portion control and making smarter food choices, such as increasing vegetables and decreasing simple carbohydrates and states she is following her eating plan approximately 35 % of the time. She states she is not exercising.   OBJECTIVE: Visit Diagnoses: Problem List Items Addressed This Visit       Other   Vitamin D deficiency - Primary   Vitamin D lab repeated at last appointment- still below goal at 41.  No nausea, vomiting or muscle weakness.  Refill 3 months of Vitamin D Rx      Relevant Medications   Vitamin D, Ergocalciferol, (DRISDOL) 1.25 MG (50000 UNIT) CAPS capsule   Morbid obesity (HCC)   Patient wants to work on consistency with meal plan and prepping and planning.  Starting weight of 309.  She is working on making a Clinical cytogeneticist and shopping off the list.  She wants to stay consistent with this.  Continue with focusing on protein intake.  Anthropometric Measurements Height: 5\' 8"  (1.727 m) Weight: (!) 305 lb (138.3 kg) BMI (Calculated): 46.39 Weight at Last Visit: 303 lb Weight Lost Since Last Visit: 0 Weight Gained Since Last Visit: 2 Starting Weight: 308 lb Total Weight Loss (lbs): 3 lb (1.361 kg) Body Composition  Body Fat %: 52.3 % Fat Mass (lbs): 160 lbs Muscle Mass (lbs): 138.4 lbs Total Body Water (lbs): 103.8 lbs Visceral Fat Rating : 19 Other Clinical Data Today's Visit #: 31 Starting  Date: 08/26/21       Other Visit Diagnoses       BMI 45.0-49.9, adult (HCC)           No data recorded       ASSESSMENT AND PLAN:  Diet: Nicole Pacheco is currently in the action stage of change. As such, her goal is to continue with weight loss efforts and has agreed to practicing portion control and making smarter food choices, such as increasing vegetables and decreasing simple carbohydrates.   Exercise:  All adults should avoid inactivity. Some activity is better than none, and adults who participate in any amount of physical activity, gain some health benefits.  Behavior Modification:  We discussed the following Behavioral Modification Strategies today: increasing lean protein intake, no skipping meals, meal planning and cooking strategies, keeping healthy foods in the home, and emotional eating strategies .   No follow-ups on file.Marland Kitchen She was informed of the importance of frequent follow up visits to maximize her success with intensive lifestyle modifications for her multiple health conditions.  Attestation Statements:   Reviewed by clinician on day of visit: allergies, medications, problem list, medical history, surgical history, family history, social history, and previous encounter notes.    Reuben Likes, MD

## 2023-03-16 NOTE — Assessment & Plan Note (Signed)
 Vitamin D lab repeated at last appointment- still below goal at 41.  No nausea, vomiting or muscle weakness.  Refill 3 months of Vitamin D Rx

## 2023-03-16 NOTE — Assessment & Plan Note (Addendum)
 Patient wants to work on consistency with meal plan and prepping and planning.  Starting weight of 309.  She is working on making a Clinical cytogeneticist and shopping off the list.  She wants to stay consistent with this.  Continue with focusing on protein intake.  Anthropometric Measurements Height: 5\' 8"  (1.727 m) Weight: (!) 305 lb (138.3 kg) BMI (Calculated): 46.39 Weight at Last Visit: 303 lb Weight Lost Since Last Visit: 0 Weight Gained Since Last Visit: 2 Starting Weight: 308 lb Total Weight Loss (lbs): 3 lb (1.361 kg) Body Composition  Body Fat %: 52.3 % Fat Mass (lbs): 160 lbs Muscle Mass (lbs): 138.4 lbs Total Body Water (lbs): 103.8 lbs Visceral Fat Rating : 19 Other Clinical Data Today's Visit #: 31 Starting Date: 08/26/21

## 2023-04-13 ENCOUNTER — Encounter (INDEPENDENT_AMBULATORY_CARE_PROVIDER_SITE_OTHER): Payer: Self-pay | Admitting: Family Medicine

## 2023-04-13 ENCOUNTER — Ambulatory Visit (INDEPENDENT_AMBULATORY_CARE_PROVIDER_SITE_OTHER): Payer: 59 | Admitting: Family Medicine

## 2023-04-13 VITALS — BP 150/81 | HR 79 | Temp 98.0°F | Ht 68.0 in | Wt 305.0 lb

## 2023-04-13 DIAGNOSIS — Z6841 Body Mass Index (BMI) 40.0 and over, adult: Secondary | ICD-10-CM

## 2023-04-13 DIAGNOSIS — F419 Anxiety disorder, unspecified: Secondary | ICD-10-CM | POA: Diagnosis not present

## 2023-04-13 DIAGNOSIS — F32A Depression, unspecified: Secondary | ICD-10-CM | POA: Diagnosis not present

## 2023-04-13 DIAGNOSIS — I10 Essential (primary) hypertension: Secondary | ICD-10-CM | POA: Diagnosis not present

## 2023-04-13 MED ORDER — LISINOPRIL 10 MG PO TABS: 10.0000 mg | ORAL_TABLET | Freq: Every day | ORAL | 0 refills | Status: AC

## 2023-04-13 MED ORDER — SERTRALINE HCL 50 MG PO TABS: 50.0000 mg | ORAL_TABLET | Freq: Every day | ORAL | 0 refills | Status: AC

## 2023-04-13 NOTE — Assessment & Plan Note (Signed)
 On sertraline daily with some improvement in motivation and anhedonia.  Needs refill today- no need to increase as improved.  3 month refill sent in.

## 2023-04-13 NOTE — Assessment & Plan Note (Signed)
 Blood pressure elevated again today.  She previously was higher dose of her medications but tapered down with weight loss.  She denies chest pain, chest pressure and headache.  Will increase lisinopril to 10mg  daily and follow up on BP at next appointment.

## 2023-04-13 NOTE — Progress Notes (Signed)
 SUBJECTIVE:  Chief Complaint: Obesity  Interim History: Patient is starting to feel a bit more motivated and has been getting outside more consistently.  She mentions work has become more stressful as her work load will increase. Since the last appointment patient has been trying to meal prep and plan and buy groceries ahead of time so food is in the house. She has been doing better with planning and prepping for meals particularly dinner meal.    Nicole Pacheco is here to discuss her progress with her obesity treatment plan. She is on the practicing portion control and making smarter food choices, such as increasing vegetables and decreasing simple carbohydrates and states she is following her eating plan approximately 25 % of the time. She states she is not exercising, but is walking.   OBJECTIVE: Visit Diagnoses: Problem List Items Addressed This Visit       Cardiovascular and Mediastinum   Essential hypertension - Primary   Blood pressure elevated again today.  She previously was higher dose of her medications but tapered down with weight loss.  She denies chest pain, chest pressure and headache.  Will increase lisinopril to 10mg  daily and follow up on BP at next appointment.      Relevant Medications   lisinopril (ZESTRIL) 10 MG tablet     Other   Anxiety and depression   On sertraline daily with some improvement in motivation and anhedonia.  Needs refill today- no need to increase as improved.  3 month refill sent in.      Relevant Medications   sertraline (ZOLOFT) 50 MG tablet   Morbid obesity (HCC)   Wants to commit to making more conscious effort to get dinner back to meal plan quantity and nutrition.  Will make goal of 3x a week.  Follow up on compliance at next appointment.       Vitals Temp: 98 F (36.7 C) BP: (!) 150/81 Pulse Rate: 79 SpO2: 98 %   Anthropometric Measurements Height: 5\' 8"  (1.727 m) Weight: (!) 305 lb (138.3 kg) BMI (Calculated): 46.39 Weight at  Last Visit: 305 lb Weight Lost Since Last Visit: 0 Weight Gained Since Last Visit: 0 Starting Weight: 308 lb Total Weight Loss (lbs): 3 lb (1.361 kg)   Body Composition  Body Fat %: 52.4 % Fat Mass (lbs): 160.2 lbs Muscle Mass (lbs): 138.2 lbs Total Body Water (lbs): 103.4 lbs Visceral Fat Rating : 19   Other Clinical Data Today's Visit #: 32 Starting Date: 08/26/21 Comments: [facility]/[place]     ASSESSMENT AND PLAN:  Diet: [name] is currently in the action stage of change. As such, her goal is to continue with weight loss efforts and has agreed to practicing portion control and making smarter food choices, such as increasing vegetables and decreasing simple carbohydrates.   Exercise:  For substantial health benefits, adults should do at least 150 minutes (2 hours and 30 minutes) a week of moderate-intensity, or 75 minutes (1 hour and 15 minutes) a week of vigorous-intensity aerobic physical activity, or an equivalent combination of moderate- and vigorous-intensity aerobic activity. Aerobic activity should be performed in episodes of at least 10 minutes, and preferably, it should be spread throughout the week.  Behavior Modification:  We discussed the following Behavioral Modification Strategies today: increasing lean protein intake, increasing vegetables, meal planning and cooking strategies, keeping healthy foods in the home, and planning for success.   Return in about 4 weeks (around 05/11/2023).Marland Kitchen She was informed of the importance of frequent follow  up visits to maximize her success with intensive lifestyle modifications for her multiple health conditions.  Attestation Statements:   Reviewed by clinician on day of visit: allergies, medications, problem list, medical history, surgical history, family history, social history, and previous encounter notes.    Reuben Likes, MD

## 2023-04-13 NOTE — Assessment & Plan Note (Signed)
 Wants to commit to making more conscious effort to get dinner back to meal plan quantity and nutrition.  Will make goal of 3x a week.  Follow up on compliance at next appointment.

## 2023-05-16 ENCOUNTER — Ambulatory Visit (INDEPENDENT_AMBULATORY_CARE_PROVIDER_SITE_OTHER): Admitting: Family Medicine

## 2023-05-17 ENCOUNTER — Other Ambulatory Visit (INDEPENDENT_AMBULATORY_CARE_PROVIDER_SITE_OTHER): Payer: Self-pay | Admitting: Family Medicine

## 2023-05-17 DIAGNOSIS — E559 Vitamin D deficiency, unspecified: Secondary | ICD-10-CM

## 2023-05-29 ENCOUNTER — Other Ambulatory Visit (INDEPENDENT_AMBULATORY_CARE_PROVIDER_SITE_OTHER): Payer: Self-pay | Admitting: Family Medicine

## 2023-05-29 DIAGNOSIS — I10 Essential (primary) hypertension: Secondary | ICD-10-CM

## 2023-05-29 DIAGNOSIS — E559 Vitamin D deficiency, unspecified: Secondary | ICD-10-CM

## 2023-05-29 DIAGNOSIS — F32A Depression, unspecified: Secondary | ICD-10-CM

## 2023-07-04 ENCOUNTER — Other Ambulatory Visit (INDEPENDENT_AMBULATORY_CARE_PROVIDER_SITE_OTHER): Payer: Self-pay | Admitting: Family Medicine

## 2023-07-04 DIAGNOSIS — E559 Vitamin D deficiency, unspecified: Secondary | ICD-10-CM

## 2023-07-04 DIAGNOSIS — I10 Essential (primary) hypertension: Secondary | ICD-10-CM

## 2023-07-04 DIAGNOSIS — F32A Depression, unspecified: Secondary | ICD-10-CM

## 2023-11-20 ENCOUNTER — Other Ambulatory Visit: Payer: Self-pay | Admitting: Obstetrics and Gynecology

## 2023-11-20 DIAGNOSIS — Z803 Family history of malignant neoplasm of breast: Secondary | ICD-10-CM
